# Patient Record
Sex: Male | Born: 1975 | Race: White | Hispanic: No | Marital: Single | Smoking: Current every day smoker
Health system: Southern US, Community
[De-identification: ages and names within clinical notes are randomized; demographics above are authoritative.]

## PROBLEM LIST (undated history)

## (undated) DIAGNOSIS — Z720 Tobacco use: Secondary | ICD-10-CM

## (undated) DIAGNOSIS — K7460 Unspecified cirrhosis of liver: Secondary | ICD-10-CM

## (undated) DIAGNOSIS — M51369 Other intervertebral disc degeneration, lumbar region without mention of lumbar back pain or lower extremity pain: Secondary | ICD-10-CM

## (undated) DIAGNOSIS — B1920 Unspecified viral hepatitis C without hepatic coma: Secondary | ICD-10-CM

## (undated) DIAGNOSIS — M5136 Other intervertebral disc degeneration, lumbar region: Secondary | ICD-10-CM

## (undated) DIAGNOSIS — G894 Chronic pain syndrome: Secondary | ICD-10-CM

## (undated) HISTORY — DX: Tobacco use: Z72.0

## (undated) HISTORY — DX: Chronic pain syndrome: G89.4

## (undated) HISTORY — DX: Unspecified cirrhosis of liver: K74.60

## (undated) HISTORY — PX: NO PAST SURGERIES: SHX2092

## (undated) HISTORY — DX: Other intervertebral disc degeneration, lumbar region: M51.36

## (undated) HISTORY — DX: Other intervertebral disc degeneration, lumbar region without mention of lumbar back pain or lower extremity pain: M51.369

## (undated) HISTORY — DX: Unspecified viral hepatitis C without hepatic coma: B19.20

---

## 2001-07-29 ENCOUNTER — Emergency Department (HOSPITAL_COMMUNITY): Admission: EM | Admit: 2001-07-29 | Discharge: 2001-07-29 | Payer: Self-pay | Admitting: Emergency Medicine

## 2001-07-29 ENCOUNTER — Encounter: Payer: Self-pay | Admitting: Emergency Medicine

## 2002-05-13 ENCOUNTER — Encounter: Payer: Self-pay | Admitting: Emergency Medicine

## 2002-05-13 ENCOUNTER — Emergency Department (HOSPITAL_COMMUNITY): Admission: EM | Admit: 2002-05-13 | Discharge: 2002-05-13 | Payer: Self-pay

## 2002-09-25 ENCOUNTER — Emergency Department (HOSPITAL_COMMUNITY): Admission: EM | Admit: 2002-09-25 | Discharge: 2002-09-25 | Payer: Self-pay | Admitting: Emergency Medicine

## 2002-10-14 ENCOUNTER — Ambulatory Visit (HOSPITAL_COMMUNITY): Admission: RE | Admit: 2002-10-14 | Discharge: 2002-10-14 | Payer: Self-pay | Admitting: *Deleted

## 2002-10-17 ENCOUNTER — Encounter: Payer: Self-pay | Admitting: *Deleted

## 2002-10-17 ENCOUNTER — Ambulatory Visit (HOSPITAL_COMMUNITY): Admission: RE | Admit: 2002-10-17 | Discharge: 2002-10-17 | Payer: Self-pay | Admitting: *Deleted

## 2002-10-24 ENCOUNTER — Encounter: Payer: Self-pay | Admitting: *Deleted

## 2002-10-24 ENCOUNTER — Ambulatory Visit (HOSPITAL_COMMUNITY): Admission: RE | Admit: 2002-10-24 | Discharge: 2002-10-24 | Payer: Self-pay | Admitting: *Deleted

## 2003-01-02 ENCOUNTER — Emergency Department (HOSPITAL_COMMUNITY): Admission: EM | Admit: 2003-01-02 | Discharge: 2003-01-02 | Payer: Self-pay | Admitting: Emergency Medicine

## 2007-06-18 ENCOUNTER — Emergency Department (HOSPITAL_COMMUNITY): Admission: EM | Admit: 2007-06-18 | Discharge: 2007-06-18 | Payer: Self-pay | Admitting: Emergency Medicine

## 2007-06-24 ENCOUNTER — Emergency Department (HOSPITAL_COMMUNITY): Admission: EM | Admit: 2007-06-24 | Discharge: 2007-06-24 | Payer: Self-pay | Admitting: Emergency Medicine

## 2007-08-13 ENCOUNTER — Emergency Department (HOSPITAL_COMMUNITY): Admission: EM | Admit: 2007-08-13 | Discharge: 2007-08-14 | Payer: Self-pay | Admitting: Emergency Medicine

## 2007-11-11 ENCOUNTER — Emergency Department (HOSPITAL_COMMUNITY): Admission: EM | Admit: 2007-11-11 | Discharge: 2007-11-11 | Payer: Self-pay | Admitting: Emergency Medicine

## 2007-12-02 ENCOUNTER — Encounter: Admission: RE | Admit: 2007-12-02 | Discharge: 2007-12-03 | Payer: Self-pay | Admitting: Anesthesiology

## 2007-12-03 ENCOUNTER — Ambulatory Visit: Payer: Self-pay | Admitting: Anesthesiology

## 2010-06-14 NOTE — Assessment & Plan Note (Signed)
[facility].  I evaluated  him and reviewed the Health and History form and 14-point review of  systems.  I reviewed available records and progress today.  I referred  through Dr. Lyn Hollingshead, relates primary care Dr. Clovis Riley.  Previously,  treated by a doctor, no longer in practice here, Dr. Lew Dawes.   Long standing low back, left leg, and shoulder pain, difficulty with  endurance and range of motion activities.  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  He relates no change  in bowel or bladder disorder, relates no advancing neurological features  of his typical pain.  He has seen orthopedist about his shoulder.  He  has seen others about his low back, states nonsurgical.  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  He relates no specific range of  motion deficit.  He relates 10/10, but he is sitting comfortably.  Made  worse by standing and activities, improved with rest and medications.  He is working about 10 hours a week.  He has not worked over the past 3  weeks and states that he must continue to work.  He describes numbness,  tingling, trouble walking, spasms, but is seen ambulating without  difficulty.  He states situational depression.  He states no wish to  harm himself or others on 14-point review of systems.   PMH remarkable for, otherwise stating healthy.  He has not had a recent  MRI.  He is divorced, but he has his fiance with him.  He smokes 1 pack  per day and I counseled.   PAST SURGICAL HISTORY:  Denied.   PAST MEDICAL HISTORY:  Denied.   FAMILY HISTORY:  Remarkable for hypertension.   REVIEW OF SYSTEMS/FAMILY AND SOCIAL HISTORY:  Otherwise, noncontributory  except for  the pain problem.   PHYSICAL EXAMINATION:  GENERAL:  A pleasant male sitting comfortably in  bed, no evidence of discomfort.  Affect appearance normal.  Oriented x3.  HEENT:  Remarkable for poor dentition.  CHEST:  Clear to auscultation and percussion with increased AP diameter.  Regular rate and rhythm without rub, murmur, or gallop.  ABDOMEN:  Soft, nontender, and benign.  No hepatosplenomegaly.  He has  left side cervical RF.  He has pain with range of motion activities with  straight leg unimpaired.  EHL is intact as his neurological assessment.  His left shoulder has a fair range of motion.  Abduction is somewhat  limited secondary to pain and he has paracervical myofascial discomfort.  I do not find anything otherwise changed here.  Good vascular integrity.   IMPRESSION:  Degenerative spondylosis lumbar spine, radicular systems,  and osteoarthritis of shoulder.   PLAN:  1. He relates the fact that OxyContin works best for him and he      requests specifically Soma by name.  2. These all related to him, we are going to need to get to know him      to better understand his pain and so we are going to be doing      pharmacy checks, central check unrevealing, so actually I have to      call the pharmacy.  We are going to also find out  who his primary      care physician is and I want him to remain in contact with that      primary care physician.  Cigarette cessation is mandatory for best      outcome.  3. I talked about interventional procedures.  4. We will start with tramadol and Flexeril at bedtime, with cautions      given.  Full informed consent UDS.  5. We have somewhat of a circular discussion about his preference for      medications, but I do not want to go and to control substances at      this time.  We want to have a better understanding, and the risk or      benefit clearly being in his favor.  He states he has not had any      medicine in sometime, then he states 3  days, so the UDS will be      revealing.   No barrier to communication.           ______________________________  Celene Kras, MD     HH/MedQ  D:  12/03/2007 11:23:36  T:  12/04/2007 00:17:14  Job #:  [id number]

## 2010-06-14 NOTE — Consult Note (Signed)
Mike Sosa, Mike Sosa NO.:  [id number]   MEDICAL RECORD NO.:  [id number]          PATIENT TYPE:  EMS   LOCATION:  MAJO                         FACILITY:  [facility]   PHYSICIAN:  Lennie Muckle, MD      DATE OF BIRTH:  04-03-1975   DATE OF CONSULTATION:  08/14/2007  DATE OF DISCHARGE:  08/14/2007                                 CONSULTATION   REASON FOR CONSULT:  Right-sided abdominal pain and flank pain.   HISTORY OF PRESENT ILLNESS:  Mike Sosa is a 35 year old male who reports  having onset of right flank pain at approximately 9 a.m. yesterday.  The  pain subsided and then returned approximately 2 p.m. yesterday.  He had  no fevers or chills, but the pain was described as intermittently sharp  in nature at 10/10, was located in the right flank region, and it did  radiate down to his lower back.  He had previously told the emergency  department that he had a right flank pain with radiation to the right  upper quadrant.  He did have associated nausea and vomiting.  He had had  also some pain with urination.  No frank blood, but had hematuria on his  UA.   He describes eating pork tenderloin yesterday and squash.  He has had no  previous episodes of right upper quadrant pain, nausea, or vomiting.  He  occasionally has reflux disease with regurgitation and a bad taste in  his mouth mostly at night.  He takes no medications for this.  He was  evaluated with a CT scan in the emergency department with no ureteral  dilation.  No ureteral or bladder calculi.  He has colonic  diverticulosis.  There was a 14-mm stone in the neck of the gallbladder  with sludge.  His ultrasound revealed a stone in the neck of the  gallbladder.  No wall thickening or pericholecystic fluid.   PAST MEDICAL HISTORY:  Negative.   SURGICAL HISTORY:  Negative.   SOCIAL HISTORY:  He does smoke approximately 1-1/2 pack per day.  No  marijuana or alcohol use.   MEDICATIONS:  He had taken Percocet  earlier today from the previous fall  and occasional p.r.n. diazepam.   REVIEW OF SYSTEMS:  Negative other than the HPI.   PHYSICAL EXAMINATION:  VITAL SIGNS:  Blood pressure is 126/85 and temp  97.5.  HEENT:  Extraocular muscles are intact.  Anicteric.  CHEST:  Clear to auscultation bilaterally.  CARDIOVASCULAR:  Regular rate and rhythm.  ABDOMEN:  Soft and nontender.  No peritoneal signs.  His abdomen is  minimally tender in the right upper quadrant with deep palpation.  He  has CVA tenderness and pain in his back on palpation.   LABORATORY DATA:  White count is normal at 8.7, hemoglobin and  hematocrit 14 and 42, and platelet counts are 113.  Serum chemistries:  Sodium 142, potassium 3.9, BUN and creatinine 19 and 1.2.  Liver enzymes  are elevated at 135, an ALT and AST.  Alkaline phosphatase is normal at  81.  Bilirubin normal at 0.7.  UA is cloudy with a large amount of  hemoglobin and small amount of leukocyte esterase.   ASSESSMENT AND PLAN:  1. Cholelithiasis without evidence of cholecystitis.  2. Costovertebral angle tenderness consistent with possible either      kidney infection or previous passage of a renal stone.  I discussed      with Mike Sosa that I felt his symptoms did not seem consistent with      symptomatic cholelithiasis or cholecystitis, and that I have felt      it is more likely a renal etiology.  He was therefore instructed to      drink plenty of fluids, and he will be given a prescription for      Percocet by the emergency department.  He does have a stone      significant in the gallbladder, but I do not think this is causing      his symptoms.  I have instructed him to follow up with the primary      care physician due to his elevation of liver enzymes, just to      repeat those, and ensure that there is nothing worrisome.  I      instructed him if he does have right upper quadrant pain or is      worried about any symptoms in this area, he should have  this      followed up.  There is no acute surgical need at the present time.      Therefore, he will be discharged home and follow up with surgery on      a p.r.n. basis.  I did offer to see in in a couple weeks for follow      up if he was not able to establish care with a PCP.      Lennie Muckle, MD  Electronically Signed     ALA/MEDQ  D:  08/14/2007  T:  08/14/2007  Job:  [id number]

## 2010-10-27 LAB — DIFFERENTIAL
Basophils Absolute: 0.1
Eosinophils Relative: 3
Lymphocytes Relative: 25
Lymphs Abs: 2.1
Neutro Abs: 5.3
Neutrophils Relative %: 61

## 2010-10-27 LAB — URINE MICROSCOPIC-ADD ON

## 2010-10-27 LAB — POCT I-STAT, CHEM 8
Creatinine, Ser: 1.2
HCT: 44
Hemoglobin: 15
Potassium: 3.9
Sodium: 142

## 2010-10-27 LAB — HEPATIC FUNCTION PANEL
ALT: 164 — ABNORMAL HIGH
AST: 135 — ABNORMAL HIGH
Albumin: 3.5
Indirect Bilirubin: 0.5
Total Protein: 6.9

## 2010-10-27 LAB — URINALYSIS, ROUTINE W REFLEX MICROSCOPIC
Specific Gravity, Urine: 1.027
Urobilinogen, UA: 1
pH: 7.5

## 2010-10-27 LAB — URINE CULTURE

## 2010-10-27 LAB — CBC: RBC: 4.49

## 2014-02-09 ENCOUNTER — Ambulatory Visit (INDEPENDENT_AMBULATORY_CARE_PROVIDER_SITE_OTHER): Payer: BLUE CROSS/BLUE SHIELD | Admitting: Internal Medicine

## 2014-02-09 VITALS — BP 140/92 | HR 99 | Temp 98.2°F | Resp 18 | Ht 73.0 in | Wt 240.6 lb

## 2014-02-09 DIAGNOSIS — G8929 Other chronic pain: Secondary | ICD-10-CM

## 2014-02-09 DIAGNOSIS — Z7189 Other specified counseling: Secondary | ICD-10-CM

## 2014-02-09 DIAGNOSIS — Z6831 Body mass index (BMI) 31.0-31.9, adult: Secondary | ICD-10-CM

## 2014-02-09 DIAGNOSIS — Z720 Tobacco use: Secondary | ICD-10-CM

## 2014-02-09 DIAGNOSIS — IMO0001 Reserved for inherently not codable concepts without codable children: Secondary | ICD-10-CM | POA: Insufficient documentation

## 2014-02-09 DIAGNOSIS — F17200 Nicotine dependence, unspecified, uncomplicated: Secondary | ICD-10-CM

## 2014-02-09 DIAGNOSIS — M5416 Radiculopathy, lumbar region: Secondary | ICD-10-CM

## 2014-02-09 DIAGNOSIS — R030 Elevated blood-pressure reading, without diagnosis of hypertension: Secondary | ICD-10-CM

## 2014-02-09 MED ORDER — OXYCODONE HCL 30 MG PO TABS: 30.0000 mg | ORAL_TABLET | ORAL | Status: DC | PRN

## 2014-02-09 NOTE — Progress Notes (Signed)
Subjective:  This chart was scribed for Mike Pearsonobert P Baran Kuhrt, MD by Mike Sosa, ED Scribe. The patient was seen in room 9. Patient's care was started at 12:51 PM. First urgent medical [facility] family care visit   Patient ID: Mike Sosa, male    DOB: 12-Dec-1975, 39 y.o.   MRN: [id number]  Chief Complaint  Patient presents with  . Back Pain    for some years   HPI HPI Comments: Mike Sosa is a 39 y.o. male who [facility] complaining of chronic back pain that radiates into L leg for many years following a 11 ft fall that caused to herniated disc. He elected to avoid surgery in 2006 in the years following that and instead was in chronic pain therapy. Pt states that he was seeing Dr. Manson [facility]seyBrown at the [facility] in [place] for 30 mg oxycodone every 4 hours as needed for 6 years. He states that the pain clinic was bought out by [facility] and they stopped accepting his insurance. Pt states that he had 60 days to make arrangements and so he changed to preferred pain management, but they have continually had a minister of problems over the past few months with him. He is unhappy with his care there.  Pt states that he has contacted another pain clinic with [place]. Genella [place], but he can not be seen for another 1.5 months.   Pt states that he has tried to stretch out his Gabapentin (when necessary at bedtime for restless legs) and Oxycodone tablets; he took his last pill yesterday. Pt has not had any back surgeries. Pt does Holiday representativeconstruction and tree work. He has been treating with an inversion table with relief. Pt has also seen a Chiropractor with no relief. Pt reports that his last MRI was approximately 5 years ago. He has trouble with gait when he doesn't take pain medication, and has to limp.  Past Medical History  Diagnosis Date  . Depression   . Anxiety    stable without any medications No current outpatient prescriptions on file prior  to visit.   No current facility-administered medications on file prior to visit.   No Known Allergies   fam hx--married/39yo at home  Review of Systems  Constitutional: Negative for appetite change, fatigue and unexpected weight change.  HENT: Negative for hearing loss and trouble swallowing.   Eyes: Negative for visual disturbance.  Respiratory: Negative for chest tightness and shortness of breath.   Cardiovascular: Negative for chest pain, palpitations and leg swelling.  Gastrointestinal: Negative.   Genitourinary: Negative.   Musculoskeletal:       No other major joint problems besides the spine  Skin: Negative.   Neurological: Negative for dizziness, weakness, light-headedness, numbness and headaches.  Hematological: Does not bruise/bleed easily.  Psychiatric/Behavioral: Negative for behavioral problems.      Objective:   Physical Exam  Constitutional: He is oriented to person, place, and time. He appears well-developed and well-nourished. No distress.  HENT:  Head: Normocephalic and atraumatic.  Eyes: Conjunctivae and EOM are normal. Pupils are equal, round, and reactive to light.  Neck: Normal range of motion. Neck supple.  Cardiovascular: Normal rate and regular rhythm.   Pulmonary/Chest: Effort normal.  Musculoskeletal: He exhibits no edema.  He has obvious Upper and lower L extremity muscle atrophy compared to the R leg.  3 beats of ankle clonus on the L.  Straight leg raise above 45  degrees on L produces lumbar pain, radicular sxt  Tender over lower lumbar in general  Neurological: He is alert and oriented to person, place, and time. No cranial nerve deficit or sensory deficit. Coordination normal.  Reflex Scores:      Patellar reflexes are 1+ on the right side and 2+ on the left side. 2+ patellar reflex on L, 1+ on R. No sensory losses noted.  Skin: Skin is warm and dry.  Psychiatric: He has a normal mood and affect. His behavior is normal. Thought content normal.   Nursing note and vitals reviewed. BP 140/92 mmHg  Pulse 99  Temp(Src) 98.2 F (36.8 C) (Oral)  Resp 18  Ht  (1.854 m)  Wt 240 lb 9.6 oz (109.135 kg)  BMI 31.75 kg/m2  SpO2 97%    Assessment & Plan:  Smoker  BMI 31.0-31.9,adult  Chronic radicular lumbar pain  Encounter for chronic pain management  Elevated BP  Patient Instructions  You should ask Dr Genella Mech for a referral to primary care in your area to address your chronic health problems:  1/Smoker---your back problems will worsen due to smoking  2/ overweight--BMI 31.0-31.9,adult  3/ Elevated BP without diagnosis of need for meds yet  At 38 you need evaluation to rule out diabetes as well as elevated cholesterol due to your obesity  You could also set up an appointment in our office with one of our new providers for this type of care if you wish---[phone] and ask for appointments Because of the muscle atrophy associated with your back problem, you need consult with orthopedics or neurosurgery in the near future  You have been given a one month prescription at the dose you reported to enable you to transition to your new pain management center. I understand you will provide [place] with your past medical records from Dr Manson Passey.    Meds ordered this encounter  Medications  . oxycodone (ROXICODONE) 30 MG immediate release tablet((NOT CAPSULES))    Sig: Take 1 tablet (30 mg total) by mouth every 4 (four) hours as needed for pain. Represents a one month supply as he is transitioning to a new pain management program.    Dispense:  180 tablet    Refill:  0   He may f/u 1 month if necessary  I have completed the patient encounter in its entirety as documented by the scribe, with editing by me where necessary. Derreon Consalvo P. Merla Riches, M.D.

## 2014-02-09 NOTE — Patient Instructions (Signed)
You should ask Dr Genella Mechainwater for a referral to primary care in your area to address your chronic health problems:  1/Smoker---your back problems will worsen due to smoking  2/ overweight--BMI 31.0-31.9,adult  3/ Elevated BP without diagnosis of need for meds yet  At 38 you need evaluation to rule out diabetes as well as elevated cholesterol due to your obesity  You could also set up an appointment in our office with one of our new providers for this type of care if you wish---[phone] and ask for appointments Because of the muscle atrophy associated with your back problem, you need consult with orthopedics or neurosurgery in the near future  You have been given a one month prescription at the dose you reported to enable you to transition to your new pain management center. I understand you will provide us with your past medical records from Dr Manson PasseyBrown.

## 2014-02-25 ENCOUNTER — Telehealth: Payer: Self-pay

## 2014-02-25 DIAGNOSIS — M545 Low back pain: Principal | ICD-10-CM

## 2014-02-25 DIAGNOSIS — G8929 Other chronic pain: Secondary | ICD-10-CM

## 2014-02-25 NOTE — Telephone Encounter (Signed)
Patient needs a referral to pain management to [place]

## 2014-02-25 NOTE — Telephone Encounter (Signed)
Dr Merla Richesoolittle, referral pending. Is this ok?

## 2014-02-27 NOTE — Telephone Encounter (Signed)
Spoke with pt, advised referral placed. 

## 2014-03-03 ENCOUNTER — Ambulatory Visit (INDEPENDENT_AMBULATORY_CARE_PROVIDER_SITE_OTHER): Payer: BLUE CROSS/BLUE SHIELD | Admitting: Internal Medicine

## 2014-03-03 VITALS — BP 130/80 | HR 95 | Temp 99.2°F | Resp 16 | Ht 72.0 in | Wt 220.4 lb

## 2014-03-03 DIAGNOSIS — M5416 Radiculopathy, lumbar region: Secondary | ICD-10-CM

## 2014-03-03 DIAGNOSIS — G8929 Other chronic pain: Secondary | ICD-10-CM

## 2014-03-03 DIAGNOSIS — Z7189 Other specified counseling: Secondary | ICD-10-CM

## 2014-03-03 DIAGNOSIS — IMO0001 Reserved for inherently not codable concepts without codable children: Secondary | ICD-10-CM

## 2014-03-03 DIAGNOSIS — R030 Elevated blood-pressure reading, without diagnosis of hypertension: Secondary | ICD-10-CM

## 2014-03-03 MED ORDER — OXYCODONE HCL 30 MG PO TABS: 30.0000 mg | ORAL_TABLET | ORAL | Status: DC | PRN

## 2014-03-03 MED ORDER — GABAPENTIN 300 MG PO CAPS: 300.0000 mg | ORAL_CAPSULE | Freq: Every day | ORAL | Status: DC

## 2014-03-03 NOTE — Progress Notes (Addendum)
Subjective:  This chart was scribed for [name], MD by Richarda Overlie, Medical scribe. This patient was seen in ROOM 5 and the patient's care was started 6:43 PM.   Patient ID: Mike Sosa, male    DOB: 02-03-1975, 39 y.o.   MRN: [id number]   Chief Complaint  Patient presents with   Medication Refill    Gabapentin & Oxycodone   HPI  HPI Comments: Mike Sosa is a 39 y.o. male with a history of depression and axiety who presents to [facility] for medication refills for gabapentin and oxycodone. Pt states that he has been doing well since his last visit to [facility]. Pt states that he needs a refill of Neurontin and says he uses it as needed to help him sleep. He needs a refill for oxycodone 30 mg every 4 hours while he is awaiting establishment of a pain management appointment with a [place] clinic operated by Dr. Genella Mech. He complains of no other symptoms at this time.    Past Medical History  Diagnosis Date   Depression    Anxiety    psychological symptoms have been stable over the past month Patient Active Problem List   Diagnosis Date Noted   Smoker--- he continues smoking  02/09/2014   BMI 31.0-31.9,adult 02/09/2014   Chronic radicular lumbar pain 02/09/2014   Encounter for chronic pain management 02/09/2014   Elevated BP 02/09/2014   Current Outpatient Prescriptions on File Prior to Visit  Medication Sig Dispense Refill   gabapentin (NEURONTIN) 300 MG capsule Take 300 mg by mouth 3 (three) times daily.     oxycodone (ROXICODONE) 30 MG immediate release tablet Take 1 tablet (30 mg total) by mouth every 4 (four) hours as needed for pain. Represents a one month supply as he is transitioning to a new pain management program. 180 tablet 0   No current facility-administered medications on file prior to visit.   No Known Allergies History reviewed. No pertinent past surgical history. History reviewed. No pertinent family history.    Review of Systems   noncontributory     Objective:   Physical Exam  Constitutional: He is oriented to person, place, and time. He appears well-developed and well-nourished. No distress.  HENT:  Head: Normocephalic and atraumatic.  Eyes: Conjunctivae and EOM are normal. Pupils are equal, round, and reactive to light.  Neck: Neck supple.  Cardiovascular: Normal rate.   Pulmonary/Chest: Effort normal.  Neurological: He is alert and oriented to person, place, and time. No cranial nerve deficit.  Psychiatric: He has a normal mood and affect. His behavior is normal.  Nursing note and vitals reviewed.  Filed Vitals:   03/03/14 1805  BP: 130/80  Pulse: 95  Temp: 99.2 F (37.3 C)  TempSrc: Oral  Resp: 16  Height: 6' (1.829 m)  Weight: 220 lb 6 oz (99.961 kg)  SpO2: 96%      Assessment & Plan:  Chronic radicular lumbar pain  Awaiting pain mgmt in [place] Dr Genella Mech  Encounter for chronic pain management  Last rx 02/09/14 for 1 month--will write for refill 1 month after this  Elevated BP  Much better today  Meds ordered this encounter  Medications   oxycodone (ROXICODONE) 30 MG immediate release tablet    Sig: Take 1 tablet (30 mg total) by mouth every 4 (four) hours as needed for pain. To follow last one month supply    Dispense:  180 tablet    Refill:  0   gabapentin (  NEURONTIN) 300 MG capsule    Sig: Take 1-2 capsules (300-600 mg total) by mouth at bedtime.    Dispense:  60 capsule    Refill:  0   Addendum 03/26/2014  ----- Message -----    From: Waymon Amatoonna F Burton    Sent: 03/24/2014  11:14 AM      To: Tonye Pearsonobert P Doolittle, MD  Pt has been declined from regional pain due to history of police records with convictions involving controlled substances   I asked referral to Inform  Patient--- because we were not aware of his prior convictions he now has to assume full responsibility for setting up follow-up with pain management. His options include [facility], [facility] ,  [facility] -Dr Hermelinda MedicusSchwartz, and Dr Roderic [place]. He should start making contact immediately ---all contact info available online---I will send a letter as well ===View-only below this line===   ----- Message -----    From: Waymon Amatoonna F Burton    Sent: 03/24/2014  11:14 AM      To: Tonye Pearsonobert P Doolittle, MD  Pt has been declined from regional pain due to history of police records with convictions involving controlled substances

## 2014-03-04 ENCOUNTER — Telehealth: Payer: Self-pay

## 2014-03-04 NOTE — Telephone Encounter (Signed)
Dr. Merla Richesoolittle ok for early refill?

## 2014-03-04 NOTE — Telephone Encounter (Signed)
Pt calling again regarding this issue. Please advise.

## 2014-03-04 NOTE — Telephone Encounter (Signed)
Patient states pharmacy will not refill his oxycodone (ROXICODONE) 30 MG immediate release tablet B/c he had it filled on the 11th of January   He is requesting an early refill.  He is leaving town today and would need it done ASAP.   [phone]

## 2014-03-04 NOTE — Telephone Encounter (Signed)
He is a new patient in our system He is awaiting pain management referral This is his second prescription refill We cannot allow early refills with pain management- he must arrange travels around his next due date would  be February 10--- the prescription was written to refill one month after his last prescription

## 2014-03-05 NOTE — Telephone Encounter (Signed)
Left a detailed message on pt's voicemail explaining request denied.

## 2014-03-05 NOTE — Telephone Encounter (Signed)
Patient returned call. He was notified and voiced understanding.

## 2014-03-26 ENCOUNTER — Encounter: Payer: Self-pay | Admitting: Internal Medicine

## 2014-03-31 ENCOUNTER — Ambulatory Visit (INDEPENDENT_AMBULATORY_CARE_PROVIDER_SITE_OTHER): Payer: BLUE CROSS/BLUE SHIELD | Admitting: Internal Medicine

## 2014-03-31 VITALS — BP 174/88 | HR 99 | Temp 98.0°F | Resp 18 | Ht 73.0 in | Wt 235.0 lb

## 2014-03-31 DIAGNOSIS — IMO0001 Reserved for inherently not codable concepts without codable children: Secondary | ICD-10-CM

## 2014-03-31 DIAGNOSIS — R030 Elevated blood-pressure reading, without diagnosis of hypertension: Secondary | ICD-10-CM

## 2014-03-31 DIAGNOSIS — Z7189 Other specified counseling: Secondary | ICD-10-CM

## 2014-03-31 DIAGNOSIS — Z720 Tobacco use: Secondary | ICD-10-CM

## 2014-03-31 DIAGNOSIS — Z8042 Family history of malignant neoplasm of prostate: Secondary | ICD-10-CM | POA: Insufficient documentation

## 2014-03-31 DIAGNOSIS — F17200 Nicotine dependence, unspecified, uncomplicated: Secondary | ICD-10-CM

## 2014-03-31 DIAGNOSIS — G8929 Other chronic pain: Secondary | ICD-10-CM

## 2014-03-31 DIAGNOSIS — M5416 Radiculopathy, lumbar region: Secondary | ICD-10-CM | POA: Diagnosis not present

## 2014-03-31 DIAGNOSIS — Z800 Family history of malignant neoplasm of digestive organs: Secondary | ICD-10-CM | POA: Insufficient documentation

## 2014-03-31 MED ORDER — OXYCODONE HCL 30 MG PO TABS: 30.0000 mg | ORAL_TABLET | ORAL | Status: DC | PRN

## 2014-03-31 MED ORDER — GABAPENTIN 300 MG PO CAPS: 300.0000 mg | ORAL_CAPSULE | Freq: Every day | ORAL | Status: DC

## 2014-03-31 NOTE — Progress Notes (Signed)
This chart was scribed for [name], MD by Luisa Dago, ED Scribe. This patient was seen in room 3 and the patient's care was started at 7:27 PM.  Subjective:    Patient ID: Mike Sosa, male    DOB: 03/09/1975, 39 y.o.   MRN: [id number]  Chief Complaint  Patient presents with  . Medication Refill    oxycodone, gabapentin   . Hypertension    HPI Mike Sosa is a 39 y.o. male with PMhx of tobacco usage, chronic radicular lumbar pain, and HTN  Pt presents to the office for a refill on his oxycodone and gabapentin cause he has been unable to establish an outpatient appointment with pain management.Marland Kitchen   [facility] [place], but we received a note for them that he was declined due to his h/o police record with convictions involving controlled substances. Pt states that his conviction was a long time ago and should not get in the way of being admitted to regional [facility]. Felony charge for marijuana. They have told him that they will call him on March 8 about an appointment however.  [place] controlled substances reporting system reveals no inappropriate prescriptions over the last 6 months--- note first visit here 02/09/2014  Pt denies any fever, neck pain, sore throat, visual disturbance, CP, cough, SOB, abdominal pain, nausea, emesis, diarrhea, urinary symptoms, back pain, HA, weakness, numbness and rash as associated symptoms.  He admits being stressed by work today and thinks this is wise blood pressures elevated.   Patient Active Problem List   Diagnosis Date Noted  . Smoker 02/09/2014  . BMI 31.0-31.9,adult 02/09/2014  . Chronic radicular lumbar pain 02/09/2014  . Encounter for chronic pain management 02/09/2014  . Elevated BP 02/09/2014   Past Medical History  Diagnosis Date  . Depression   . Anxiety    No past surgical history on file. No Known Allergies Prior to Admission medications   Medication Sig Start Date End  Date Taking? Authorizing Provider  gabapentin (NEURONTIN) 300 MG capsule Take 1-2 capsules (300-600 mg total) by mouth at bedtime. 03/03/14  Yes Tonye Pearson, MD  oxycodone (ROXICODONE) 30 MG immediate release tablet Take 1 tablet (30 mg total) by mouth every 4 (four) hours as needed for pain. To follow last one month supply 03/03/14  Yes Tonye Pearson, MD   Continues smoking  Family history includes colon cancer in mother and prostate cancer in father   Review of Systems  Constitutional: Negative for fever and chills.  HENT: Negative for congestion.   Respiratory: Negative for cough and shortness of breath.   Cardiovascular: Negative for chest pain.  Gastrointestinal: Negative for nausea, vomiting, abdominal pain and diarrhea.  Genitourinary: Negative for difficulty urinating.  Skin: Negative for rash.  Neurological: Negative for dizziness, weakness, numbness and headaches.   Objective:   Physical Exam  Constitutional: He is oriented to person, place, and time. He appears well-developed and well-nourished.  HENT:  Head: Normocephalic and atraumatic.  Eyes: EOM are normal. Pupils are equal, round, and reactive to light.  Cardiovascular: Normal rate.   Pulmonary/Chest: Effort normal.  Musculoskeletal: He exhibits no edema.  Neurological: He is alert and oriented to person, place, and time. No cranial nerve deficit.  Skin: Skin is warm and dry.  Psychiatric: He has a normal mood and affect. His behavior is normal. Thought content normal.  Nursing note and vitals reviewed.  BP 174/88 mmHg  Pulse 99  Temp(Src) 98 F (36.7 C) (Oral)  Resp 18  Ht 6\' 1"  (1.854 m)  Wt 235 lb (106.595 kg)  BMI 31.01 kg/m2  SpO2 98%   Assessment & Plan:  Chronic radicular lumbar pain--- pain is controlled by current regimen  Encounter for chronic pain management--- I have now given him the names of4- 5 different pain management groups locally for him to contact as his next step. He is  advised to get his old records if at all possible. His MRI was 6 years ago. He may need to go back through a full back evaluation.  Elevated BP--- once again he is given the number appointment center to set up an evaluation is appropriate for his age and further investigations with regard to smoking and hypertension as well as his important family history.  Smoker-not ready to quit    I have completed the patient encounter in its entirety as documented by the scribe, with editing by me where necessary. Talley Kreiser P. Merla Richesoolittle, M.D.

## 2014-04-02 ENCOUNTER — Telehealth: Payer: Self-pay

## 2014-04-02 DIAGNOSIS — G894 Chronic pain syndrome: Secondary | ICD-10-CM

## 2014-04-02 DIAGNOSIS — G8929 Other chronic pain: Secondary | ICD-10-CM

## 2014-04-02 DIAGNOSIS — M549 Dorsalgia, unspecified: Secondary | ICD-10-CM

## 2014-04-02 NOTE — Telephone Encounter (Signed)
Patient requesting to be referred to [facility]. Patients call back number is [phone]

## 2014-04-02 NOTE — Telephone Encounter (Signed)
Assessment & Plan:  Chronic radicular lumbar pain--- pain is controlled by current regimen  Encounter for chronic pain management--- I have now given him the names of4- 5 different pain management groups locally for him to contact as his next step. He is advised to get his old records if at all possible. His MRI was 6 years ago. He may need to go back through a full back evaluation.  Elevated BP--- once again he is given the number appointment center to set up an evaluation is appropriate for his age and further investigations with regard to smoking and hypertension as well as his important family history.  Smoker-not ready to quit        Dr. Merla Richesoolittle, please advise.

## 2014-04-03 NOTE — Telephone Encounter (Signed)
[facility] referral placed

## 2014-04-03 NOTE — Telephone Encounter (Signed)
Okay to establish that referral for chronic pain syndrome and chronic back pain

## 2014-05-04 ENCOUNTER — Ambulatory Visit (INDEPENDENT_AMBULATORY_CARE_PROVIDER_SITE_OTHER): Payer: BLUE CROSS/BLUE SHIELD

## 2014-05-04 ENCOUNTER — Ambulatory Visit (INDEPENDENT_AMBULATORY_CARE_PROVIDER_SITE_OTHER): Payer: BLUE CROSS/BLUE SHIELD | Admitting: Internal Medicine

## 2014-05-04 VITALS — BP 148/78 | HR 100 | Temp 98.1°F | Resp 16 | Ht 73.0 in | Wt 255.0 lb

## 2014-05-04 DIAGNOSIS — M5136 Other intervertebral disc degeneration, lumbar region: Secondary | ICD-10-CM | POA: Diagnosis not present

## 2014-05-04 DIAGNOSIS — M545 Low back pain, unspecified: Secondary | ICD-10-CM

## 2014-05-04 DIAGNOSIS — M25552 Pain in left hip: Secondary | ICD-10-CM | POA: Diagnosis not present

## 2014-05-04 DIAGNOSIS — G8929 Other chronic pain: Secondary | ICD-10-CM

## 2014-05-04 MED ORDER — OXYCODONE HCL 30 MG PO TABS: 30.0000 mg | ORAL_TABLET | ORAL | Status: DC | PRN

## 2014-05-04 MED ORDER — GABAPENTIN 300 MG PO CAPS: 300.0000 mg | ORAL_CAPSULE | Freq: Every day | ORAL | Status: DC

## 2014-05-04 NOTE — Progress Notes (Addendum)
Subjective:  This chart was scribed for Mike Siaobert Collan Schoenfeld, MD by [facility] IncNadim Abu Sosa, medical scribe at [facility].The patient was seen in exam room 11 and the patient's care was started at 7:10 PM.   Patient ID: Mike SkainsAndre M Sosa, male    DOB: September 03, 1975, 39 y.o.   MRN: [id number] Chief Complaint  Patient presents with  . Follow-up    Medication refill   HPI HPI Comments: Mike Skainsndre M Sieben is a 39 y.o. male who presents to [facility] for a medication refill. Note that at his last visit he was referred [facility] [place] and they have requested more recent imaging before they will begin treatment.  His last imaging was in 2009. He has been on pain medicine for a long time for his problems prior to his initial visit here. Pt states most pain is in left lower back radiating down his left leg to his toes. He has developed weakness in his left leg as well as numbness in his toes, the balls of his feet.  He gets in trouble when he is active. Pt states he helped fix his parents septic system 2 weeks ago, and after working his pain was more severe in his leg and he had trouble with ambulating.   Over the past 3 months he also developed pain in his left hip. This hurts while sitting and may ache at night. It hurts to step up on a ladder. No known injury. No swelling.  He still has not called to set up a CPE at 104  Patient Active Problem List   Diagnosis Date Noted  . Family hx of colon cancer 03/31/2014  . Family hx of prostate cancer 03/31/2014  . Smoker 02/09/2014  . BMI 31.0-31.9,adult 02/09/2014  . Chronic radicular lumbar pain 02/09/2014  . Encounter for chronic pain management 02/09/2014  . Elevated BP 02/09/2014   Past Medical History  Diagnosis Date  . Depression   . Anxiety    History reviewed. No pertinent past surgical history. No Known Allergies Prior to Admission medications   Medication Sig Start Date End Date Taking?  Authorizing Provider  gabapentin (NEURONTIN) 300 MG capsule Take 1-2 capsules (300-600 mg total) by mouth at bedtime. 03/31/14  Yes Tonye Pearsonobert P Aadhav Uhlig, MD  oxycodone (ROXICODONE) 30 MG immediate release tablet Take 1 tablet (30 mg total) by mouth every 4 (four) hours as needed for pain. To follow last one month supply 03/31/14  Yes Tonye Pearsonobert P Amro Winebarger, MD   Review of Systems  Constitutional: Negative for fever, chills, fatigue and unexpected weight change.  Genitourinary: Negative for frequency and difficulty urinating.  Musculoskeletal: Positive for back pain and gait problem.  Neurological: Positive for weakness and numbness.      Objective:  BP 148/78 mmHg  Pulse 100  Temp(Src) 98.1 F (36.7 C)  Resp 16  Ht 6\' 1"  (1.854 m)  Wt 255 lb (115.667 kg)  BMI 33.65 kg/m2  SpO2 97%  Physical Exam  Constitutional: He is oriented to person, place, and time. He appears well-developed and well-nourished. No distress.  HENT:  Head: Normocephalic and atraumatic.  Eyes: Pupils are equal, round, and reactive to light.  Neck: Normal range of motion.  Cardiovascular: Normal rate and regular rhythm.   Pulmonary/Chest: Effort normal. No respiratory distress.  Musculoskeletal: Normal range of motion.  Tender left lumbar with pain on straight leg raise on the left to 45 degrees. No sensory or motor  losses noted. The left hip has pain with external rotation, pain over the greater trochanter and decreased ROM. There is mild atrophy in left upper and lower areas of the lower extremity.  Neurological: He is alert and oriented to person, place, and time.  Skin: Skin is warm and dry.  Psychiatric: He has a normal mood and affect. His behavior is normal.  Nursing note and vitals reviewed.    [facility] reading (PRIMARY) by  Dr. Merla Riches degenerative changes of the lumbar spine. Hip appears normal   Assessment & Plan:  DDD (degenerative disc disease), lumbar - Plan: DG Lumbar Spine 2-3 Views  Pain, hip, left -  Plan: DG HIP UNILAT WITH PELVIS 2-3 VIEWS LEFT  secondary to trochanteric bursitis versus underlying osteoarthritis/NSAIDS plus ROM  We'll go ahead and schedule his MRI in preparation for his transfer to chronic pain management unless there are surgical alternatives that are seen Meds ordered this encounter  Medications  . gabapentin (NEURONTIN) 300 MG capsule    Sig: Take 1-2 capsules (300-600 mg total) by mouth at bedtime.    Dispense:  60 capsule    Refill:  0  . oxycodone (ROXICODONE) 30 MG immediate release tablet    Sig: Take 1 tablet (30 mg total) by mouth every 4 (four) hours as needed for pain. To follow last one month supply    Dispense:  180 tablet    Refill:  0      I have completed the patient encounter in its entirety as documented by the scribe, with editing by me where necessary. Modelle Vollmer P. Merla Riches, M.D.

## 2014-05-26 ENCOUNTER — Ambulatory Visit (INDEPENDENT_AMBULATORY_CARE_PROVIDER_SITE_OTHER): Payer: BLUE CROSS/BLUE SHIELD | Admitting: Internal Medicine

## 2014-05-26 VITALS — BP 150/80 | HR 100 | Temp 99.8°F | Ht 72.5 in | Wt 257.0 lb

## 2014-05-26 DIAGNOSIS — G894 Chronic pain syndrome: Secondary | ICD-10-CM

## 2014-05-26 DIAGNOSIS — IMO0001 Reserved for inherently not codable concepts without codable children: Secondary | ICD-10-CM

## 2014-05-26 DIAGNOSIS — K429 Umbilical hernia without obstruction or gangrene: Secondary | ICD-10-CM

## 2014-05-26 DIAGNOSIS — Z7189 Other specified counseling: Secondary | ICD-10-CM

## 2014-05-26 DIAGNOSIS — M5136 Other intervertebral disc degeneration, lumbar region: Secondary | ICD-10-CM | POA: Diagnosis not present

## 2014-05-26 DIAGNOSIS — M5416 Radiculopathy, lumbar region: Secondary | ICD-10-CM

## 2014-05-26 DIAGNOSIS — G8929 Other chronic pain: Secondary | ICD-10-CM

## 2014-05-26 DIAGNOSIS — R030 Elevated blood-pressure reading, without diagnosis of hypertension: Secondary | ICD-10-CM

## 2014-05-26 MED ORDER — GABAPENTIN 300 MG PO CAPS: 300.0000 mg | ORAL_CAPSULE | Freq: Every day | ORAL | Status: DC

## 2014-05-26 MED ORDER — OXYCODONE HCL 30 MG PO TABS: 30.0000 mg | ORAL_TABLET | ORAL | Status: DC | PRN

## 2014-05-26 NOTE — Progress Notes (Signed)
Subjective:    Patient ID: Mike Sosa, male    DOB: 1975-01-31, 39 y.o.   MRN: [id number]  This chart was scribed for Tonye Pearson, MD by Ronney Lion, ED Scribe. This patient was seen in room 9 and the patient's care was started at 6:18 PM.   HPI   Chief Complaint  Patient presents with  . Medication Refill    Needs refill on Oxycodone  . Hernia    Want to have it checked out-denies any pain    HPI Comments: Mike Sosa is a 39 y.o. male who presents to the [facility] complaining of a possible umbilical hernia that he noticed 1 month ago and has been increasing in size since. Patient currently wears a back brace. He feels this offers his abdomen support where the swelling is noticed  We are prescribing pain medicine for his chronic back problems as it was prescribed by his last physician who retired. We are awaiting acceptance at pain management in [place], and they have required an MRI first He was called to schedule an MRI yesterday for his chronic radicular lumbar pain. 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 He has not developed any new work limitations or weakness over the past year. His last several office visits chronicles this problem well  Patient Active Problem List   Diagnosis Date Noted  . Chronic radicular lumbar pain 02/09/2014    Priority: Medium  . Umbilical hernia without obstruction and without gangrene 05/26/2014  . Family hx of colon cancer 03/31/2014  . Family hx of prostate cancer 03/31/2014  . Smoker 02/09/2014  . BMI 31.0-31.9,adult 02/09/2014  . Encounter for chronic pain management 02/09/2014  . Elevated BP 02/09/2014   he has failed to establish a PCP for medical follow-up and I will try once again to establish an appointment for him   Past Medical History  Diagnosis Date  . Depression   . Anxiety     Prior to Admission medications   Medication Sig Start Date End Date Taking?  Authorizing Provider  gabapentin (NEURONTIN) 300 MG capsule Take 1-2 capsules (300-600 mg total) by mouth at bedtime. 05/04/14  Yes Tonye Pearson, MD  oxycodone (ROXICODONE) 30 MG immediate release tablet Take 1 tablet (30 mg total) by mouth every 4 (four) hours as needed for pain. To follow last one month supply 05/04/14  Yes Tonye Pearson, MD    No Known Allergies   Review of Systems  Constitutional: Negative for appetite change, fatigue and unexpected weight change.  Eyes: Negative for visual disturbance.  Respiratory: Negative for shortness of breath.   Cardiovascular: Negative for chest pain and palpitations.  Gastrointestinal: Negative for abdominal pain.  Genitourinary: Negative for difficulty urinating.  Musculoskeletal: Negative for joint swelling and neck pain.  Neurological: Negative for headaches.  Psychiatric/Behavioral: Negative for hallucinations, confusion, sleep disturbance and dysphoric mood.       Objective:   Physical Exam  Constitutional: He is oriented to person, place, and time. He appears well-developed and well-nourished. No distress.  HENT:  Head: Normocephalic.  Eyes: Conjunctivae and EOM are normal. Pupils are equal, round, and reactive to light.  Neck: Normal range of motion. Neck supple.  Cardiovascular: Normal rate.   Pulmonary/Chest: Effort normal.  Abdominal:  Soft bulge at the umbilicus 3 cm diameter nontender and nonincarcerated///no other abdominal masses  Neurological: He is alert and oriented to person, place, and  time. No cranial nerve deficit. Coordination normal.  Psychiatric: He has a normal mood and affect.  Nursing note and vitals reviewed. BP 150/80 mmHg  Pulse 100  Temp(Src) 99.8 F (37.7 C) (Oral)  Ht 6' 0.5" (1.842 m)  Wt 257 lb (116.574 kg)  BMI 34.36 kg/m2  SpO2 98%      Assessment & Plan:  DDD (degenerative disc disease), lumbar Chronic pain syndrome Chronic radicular lumbar pain Encounter for chronic pain  management  He understands that he is in transition with us prescribing medicine only until we can get him enrolled in  chronic pain management or arrange follow-up with an orthopedist who can solve his problem  Elevated BP--he needs follow-up for this and other medical history features///he needs a PCP would like to come to our place/ I will schedule an exam for him provider accepting new patients  Umbilical hernia without obstruction and without gangrene--- he would like to wait on surgical evaluation of this until his work slows down in the fall or winter  Meds ordered this encounter  Medications  . oxycodone (ROXICODONE) 30 MG immediate release tablet    Sig: Take 1 tablet (30 mg total) by mouth every 4 (four) hours as needed for pain. To follow last one month supply given 05/04/14--for filling 06/02/14/or/06/03/14    Dispense:  180 tablet    Refill:  0  . gabapentin (NEURONTIN) 300 MG capsule    Sig: Take 1-2 capsules (300-600 mg total) by mouth at bedtime.    Dispense:  60 capsule    Refill:  0    I have completed the patient encounter in its entirety as documented by the scribe, with editing by me where necessary. Coley Littles P. Merla Richesoolittle, M.D.

## 2014-05-28 ENCOUNTER — Telehealth: Payer: Self-pay

## 2014-05-28 NOTE — Telephone Encounter (Signed)
Bound by the regs--can't do early refills and last rx was the 4th I believe

## 2014-05-28 NOTE — Telephone Encounter (Signed)
Pt called and stated that his pharm would be able to fill his next Rx of oxycodone on Sunday but they are not opened Sunday. He is leaving to go out of town on Monday and would like us to call and OK filling the Rx on Sat so that he can get it before he goes out of town. Dr Merla Richesoolittle, you note says that script would be due to be filled on 5/3 or 5/4. Pt stated that he has never asked to fill one early before and asked if Dr Merla Richesoolittle would OK it?

## 2014-05-29 NOTE — Telephone Encounter (Signed)
lmom to cb. 

## 2014-05-29 NOTE — Telephone Encounter (Signed)
Patient is requesting that Dr. Merla Richesoolittle call him. Patient would not disclose information, he just looking to speak with Merla Richesoolittle.   [phone]

## 2014-06-03 NOTE — Telephone Encounter (Signed)
Pt never called back. It is now after the 4th, so pt should have been able to get Rx filled.

## 2014-06-23 ENCOUNTER — Telehealth: Payer: Self-pay

## 2014-06-23 NOTE — Telephone Encounter (Signed)
Can we put in referral again?

## 2014-06-23 NOTE — Telephone Encounter (Signed)
Patient is requesting Dr Merla Richesoolittle to put in an order for him to have an MRI Lumbar Spine W/O contrast [facility] [place] [phone]). Per patient it was previously ordered but he never had it done and the order has expired. Patients call back number is [phone]

## 2014-06-24 NOTE — Telephone Encounter (Signed)
Spoke to [place] and she will take care of it.

## 2014-06-24 NOTE — Telephone Encounter (Signed)
It looks like the order doesn't expire until 07/04/2015.

## 2014-07-05 ENCOUNTER — Ambulatory Visit (INDEPENDENT_AMBULATORY_CARE_PROVIDER_SITE_OTHER): Payer: BLUE CROSS/BLUE SHIELD | Admitting: Internal Medicine

## 2014-07-05 VITALS — BP 144/86 | HR 93 | Temp 98.4°F | Resp 16 | Ht 73.0 in | Wt 267.4 lb

## 2014-07-05 DIAGNOSIS — G8929 Other chronic pain: Secondary | ICD-10-CM | POA: Diagnosis not present

## 2014-07-05 DIAGNOSIS — M5416 Radiculopathy, lumbar region: Secondary | ICD-10-CM | POA: Diagnosis not present

## 2014-07-05 MED ORDER — OXYCODONE HCL 30 MG PO TABS: 30.0000 mg | ORAL_TABLET | ORAL | Status: DC | PRN

## 2014-07-05 MED ORDER — GABAPENTIN 300 MG PO CAPS: 300.0000 mg | ORAL_CAPSULE | Freq: Every day | ORAL | Status: DC

## 2014-07-05 NOTE — Progress Notes (Signed)
Subjective:  This chart was scribed for Ellamae Siaobert Artyom Stencel, MD by Andrew Auaven Small, ED Scribe. This patient was seen in room 1 and the patient's care was started at 1:13 PM.   Patient ID: Mike Sosa, male    DOB: [birth year], 39 y.o.   MRN: [id number]  HPI   Chief Complaint  Patient presents with  . Medication Refill    gabapentin, oxycodone   HPI Comments: Mike Skainsndre M Hustead is a 39 y.o. male who presents to the [facility] for a medication refill of gabapentin and oxycodone. Pt states he is doing well. He plans on taking a vacation in a couple weeks. Pt is requesting to schedule an MRI of lumbar spine.  Patient Active Problem List   Diagnosis Date Noted  . Umbilical hernia without obstruction and without gangrene 05/26/2014  . Family hx of colon cancer 03/31/2014  . Family hx of prostate cancer 03/31/2014  . Smoker 02/09/2014  . BMI 31.0-31.9,adult 02/09/2014  . Chronic radicular lumbar pain 02/09/2014  . Encounter for chronic pain management 02/09/2014  . Elevated BP 02/09/2014   Past Medical History  Diagnosis Date  . Depression   . Anxiety    History reviewed. No pertinent past surgical history. No Known Allergies Prior to Admission medications   Medication Sig Start Date End Date Taking? Authorizing Provider  gabapentin (NEURONTIN) 300 MG capsule Take 1-2 capsules (300-600 mg total) by mouth at bedtime. 07/05/14  Yes Tonye Pearsonobert P Davan Hark, MD  oxycodone (ROXICODONE) 30 MG immediate release tablet Take 1 tablet (30 mg total) by mouth every 4 (four) hours as needed for pain. 07/05/14  Yes Tonye Pearsonobert P Luvenia Cranford, MD  oxycodone (ROXICODONE) 30 MG immediate release tablet Take 1 tablet (30 mg total) by mouth every 4 (four) hours as needed for pain. For 30 days after signed 07/05/14   Tonye Pearsonobert P Ohanna Gassert, MD   Review of Systems  Objective:   Physical Exam  Constitutional: He is oriented to person, place, and time. He appears well-developed and well-nourished. No distress.  HENT:    Head: Normocephalic and atraumatic.  Eyes: Conjunctivae and EOM are normal.  Neck: Neck supple.  Cardiovascular: Normal rate.   Pulmonary/Chest: Effort normal.  Musculoskeletal: Normal range of motion.  Neurological: He is alert and oriented to person, place, and time.  Skin: Skin is warm and dry.  Psychiatric: He has a normal mood and affect. His behavior is normal.  Nursing note and vitals reviewed.  Assessment & Plan:  Chronic radicular lumbar pain    Meds ordered this encounter  Medications  . gabapentin (NEURONTIN) 300 MG capsule    Sig: Take 1-2 capsules (300-600 mg total) by mouth at bedtime.    Dispense:  60 capsule    Refill:  5  . oxycodone (ROXICODONE) 30 MG immediate release tablet    Sig: Take 1 tablet (30 mg total) by mouth every 4 (four) hours as needed for pain.    Dispense:  180 tablet    Refill:  0  . oxycodone (ROXICODONE) 30 MG immediate release tablet    Sig: Take 1 tablet (30 mg total) by mouth every 4 (four) hours as needed for pain. For 30 days after signed    Dispense:  180 tablet    Refill:  0  MRI Then ortho plan CPE Oct 16 umbil hern fall F/u 2 mos I have completed the patient encounter in its entirety as documented by the scribe, with editing by me where necessary. Molly Maduroobert  P. Laney Pastor, M.D.

## 2014-07-06 ENCOUNTER — Ambulatory Visit: Payer: Self-pay | Admitting: Physician Assistant

## 2014-07-08 ENCOUNTER — Telehealth: Payer: Self-pay | Admitting: Family Medicine

## 2014-07-08 NOTE — Telephone Encounter (Signed)
Patient wants to know if you know about a cream that he can rub on his back for back pain and if so if you can call him in a RX for the cream

## 2014-07-08 NOTE — Telephone Encounter (Signed)
Unsure what this would be

## 2014-07-08 NOTE — Progress Notes (Signed)
Patient will call us back to make his appt Set up PE with a PA at 104 in oct 2016

## 2014-07-09 ENCOUNTER — Telehealth: Payer: Self-pay | Admitting: Family Medicine

## 2014-07-09 ENCOUNTER — Other Ambulatory Visit: Payer: Self-pay | Admitting: Radiology

## 2014-07-09 DIAGNOSIS — M545 Low back pain: Secondary | ICD-10-CM

## 2014-07-09 MED ORDER — DICLOFENAC SODIUM 1 % TD GEL: 2.0000 g | Freq: Four times a day (QID) | TRANSDERMAL | Status: DC

## 2014-07-09 NOTE — Telephone Encounter (Signed)
Ok to call in voltaren gel tho he may not want to pay for it

## 2014-07-09 NOTE — Telephone Encounter (Signed)
Dr. Donn Pierini medicine that the patient is wanting is Voltaren for his back pain

## 2014-07-09 NOTE — Telephone Encounter (Signed)
Rx sent to pharmacy   

## 2014-07-09 NOTE — Telephone Encounter (Signed)
Spoke with pt, advised him to call back to give me the name of the cream.

## 2014-07-09 NOTE — Telephone Encounter (Signed)
Can someone rx this for him? I'm not having success in doing so.

## 2014-07-10 ENCOUNTER — Telehealth: Payer: Self-pay | Admitting: Family Medicine

## 2014-07-10 NOTE — Telephone Encounter (Signed)
Left message Rx sent in. 

## 2014-07-10 NOTE — Telephone Encounter (Signed)
lmom for patient to pick RX up from pharmacist

## 2014-07-14 ENCOUNTER — Telehealth: Payer: Self-pay

## 2014-07-14 NOTE — Telephone Encounter (Signed)
-----   Message -----    From: Cordella Register    Sent: 05/05/2014  2:08 PM     To: Tonye Pearson, MD      Per [facility] patient has already had an MRI of the Lumbar Spine ordered by Dr Christella Hartigan in [facility]. This procedure has been preformed at [facility]. The order was put in the same day you put yours in. I will not beable to obtain authorizations since service has already been authorized by another provider. I have left message with patient letting them know.         MRi of what?

## 2014-07-14 NOTE — Telephone Encounter (Signed)
Pt needs a referral for MRI.

## 2014-07-15 ENCOUNTER — Telehealth: Payer: Self-pay

## 2014-07-15 NOTE — Telephone Encounter (Signed)
PA started for voltaren gel on covermymeds. I don't believe it will be covered for pt's back or hip pain. It is normally only covered by this ins for joint pain from elbows to fingers or knees to feet. But I will send in for review. The form does want the answer to the following question and I can not reach the pt. Dr Merla Riches, do you know the answer to the following?  Please check any that apply to the patient:  1. Has had at least a 1-week trial of a therapeutic dose of an oral NSAID  2. Has experienced intolerable side effects to oral NSAIDs   Or, 3. Has risks for upper gastrointestinal (GI) bleeding, such as one of the following: Age 24 years or greater, history of peptic ulcer disease or ulcer/GI bleeding related to oral NSAIDs, current regimen includes anticoagulant, prescription anti-platelet drug, corticosteroid or DMARD (disease-modifying and anti-rheumatic drug) therapy or hereditary or acquired coagulation defect (e.g., hemophilia, Von Willebrand's disease, protein C or S deficiency, thrombocytopenia or chronic renal failure)

## 2014-07-16 NOTE — Telephone Encounter (Signed)
Spoke with pt, he would like a MRI of his lower back. at [facility]. He would like it scheduled after next week.

## 2014-07-17 NOTE — Telephone Encounter (Signed)
So do we order or does the other order still stand???--have this patient get [place] records from the other provider

## 2014-07-17 NOTE — Telephone Encounter (Signed)
Tried Nsaids without relief

## 2014-07-21 NOTE — Telephone Encounter (Signed)
Completed PA answers on covermymeds. Pending.

## 2014-07-24 NOTE — Telephone Encounter (Signed)
We will let it go

## 2014-07-24 NOTE — Telephone Encounter (Signed)
Notified pharm PA was denied and pt would have to pay cash if he wants this medication.

## 2014-07-24 NOTE — Telephone Encounter (Signed)
PA was denied for voltaren because it is not covered for back or hip pain, only covered for elbow, wrist, hand, knee or ankle OA. Dr Merla Riches, do you want to write for any other meds?

## 2014-08-29 ENCOUNTER — Ambulatory Visit (INDEPENDENT_AMBULATORY_CARE_PROVIDER_SITE_OTHER): Payer: BLUE CROSS/BLUE SHIELD | Admitting: Internal Medicine

## 2014-08-29 VITALS — BP 122/80 | HR 106 | Temp 98.4°F | Resp 18 | Ht 73.0 in | Wt 251.0 lb

## 2014-08-29 DIAGNOSIS — G8929 Other chronic pain: Secondary | ICD-10-CM

## 2014-08-29 DIAGNOSIS — M5416 Radiculopathy, lumbar region: Secondary | ICD-10-CM

## 2014-08-29 MED ORDER — OXYCODONE HCL 30 MG PO TABS: 30.0000 mg | ORAL_TABLET | ORAL | Status: DC | PRN

## 2014-08-29 NOTE — Progress Notes (Signed)
   Subjective:    Patient ID: Mike Sosa, male   Mike [birth year], 39 y.o.   MRN: [id number] This chart was scribed for [name], MD by Jolene Provost, Medical Scribe. This patient was seen in Room 2 and the patient's care was started a 3:38 PM.  Chief Complaint  Patient presents with  . Medication Refill    lasix    HPI HPI Comments: Mike Sosa is a 39 y.o. male who presents to [facility] reporting for a medication refill. Pt states he has not gotten his MRI, which he agreed to do at his last visit because it was scheduled without his knowledge by a pain management clinic, and he missed his appointment.  Just a per Trail is in the notes from referrals. [facility] [employer] refused his MRI because he is scheduled by pain management clinic in [place] already and then they discontinued the appointment scheduled because he stopped going to the clinic.   Review of Systems  Constitutional: Negative for fever and chills.  Musculoskeletal: Positive for back pain.       Objective:   Physical Exam  Constitutional: He is oriented to person, place, and time. He appears well-developed and well-nourished. No distress.  HENT:  Head: Normocephalic and atraumatic.  Eyes: Pupils are equal, round, and reactive to light.  Neck: Neck supple.  Cardiovascular: Normal rate.   Pulmonary/Chest: Effort normal. No respiratory distress.  Musculoskeletal: Normal range of motion.  Neurological: He is alert and oriented to person, place, and time. Coordination normal.  Skin: Skin is warm and dry. He is not diaphoretic.  Psychiatric: He has a normal mood and affect. His behavior is normal.  Nursing note and vitals reviewed.      Assessment & Plan:  Chronic radicular lumbar pain - Plan: MR Lumbar Spine Wo Contrast  Meds ordered this encounter  Medications  . oxycodone (ROXICODONE) 30 MG immediate release tablet    Sig: Take 1 tablet (30 mg total) by mouth every 4 (four) hours as needed for pain.   Dispense:  180 tablet    Refill:  0   The plan continues to be #1 do MRI to [place] disease number to refer to orthopedics for consideration of non-OB UA possibilities #3 use this to decide upon surgery versus chronic pain treatment.(  I have completed the patient encounter in its entirety as documented by the scribe, with editing by me where necessary. Courtni Balash P. Merla Riches, M.D.

## 2014-09-12 ENCOUNTER — Ambulatory Visit
Admission: RE | Admit: 2014-09-12 | Discharge: 2014-09-12 | Disposition: A | Discharge status: Home / Self Care | Payer: BLUE CROSS/BLUE SHIELD | Source: Ambulatory Visit | Attending: Internal Medicine | Admitting: Internal Medicine

## 2014-09-12 DIAGNOSIS — M5416 Radiculopathy, lumbar region: Principal | ICD-10-CM

## 2014-09-12 DIAGNOSIS — G8929 Other chronic pain: Secondary | ICD-10-CM

## 2014-09-28 ENCOUNTER — Ambulatory Visit (INDEPENDENT_AMBULATORY_CARE_PROVIDER_SITE_OTHER): Payer: BLUE CROSS/BLUE SHIELD | Admitting: Internal Medicine

## 2014-09-28 VITALS — BP 146/84 | HR 88 | Temp 98.4°F | Resp 18 | Ht 73.0 in | Wt 270.4 lb

## 2014-09-28 DIAGNOSIS — J22 Unspecified acute lower respiratory infection: Secondary | ICD-10-CM

## 2014-09-28 DIAGNOSIS — R030 Elevated blood-pressure reading, without diagnosis of hypertension: Secondary | ICD-10-CM | POA: Diagnosis not present

## 2014-09-28 DIAGNOSIS — Z720 Tobacco use: Secondary | ICD-10-CM

## 2014-09-28 DIAGNOSIS — J988 Other specified respiratory disorders: Secondary | ICD-10-CM | POA: Diagnosis not present

## 2014-09-28 DIAGNOSIS — G8929 Other chronic pain: Secondary | ICD-10-CM | POA: Diagnosis not present

## 2014-09-28 DIAGNOSIS — F17200 Nicotine dependence, unspecified, uncomplicated: Secondary | ICD-10-CM

## 2014-09-28 DIAGNOSIS — Z8042 Family history of malignant neoplasm of prostate: Secondary | ICD-10-CM

## 2014-09-28 DIAGNOSIS — M5416 Radiculopathy, lumbar region: Secondary | ICD-10-CM | POA: Diagnosis not present

## 2014-09-28 DIAGNOSIS — IMO0001 Reserved for inherently not codable concepts without codable children: Secondary | ICD-10-CM

## 2014-09-28 DIAGNOSIS — Z800 Family history of malignant neoplasm of digestive organs: Secondary | ICD-10-CM | POA: Diagnosis not present

## 2014-09-28 DIAGNOSIS — Z7189 Other specified counseling: Secondary | ICD-10-CM | POA: Diagnosis not present

## 2014-09-28 DIAGNOSIS — J4520 Mild intermittent asthma, uncomplicated: Secondary | ICD-10-CM

## 2014-09-28 MED ORDER — PREDNISONE 20 MG PO TABS: ORAL_TABLET | ORAL | Status: DC

## 2014-09-28 MED ORDER — AZITHROMYCIN 250 MG PO TABS: ORAL_TABLET | ORAL | Status: DC

## 2014-09-28 MED ORDER — OXYCODONE HCL 30 MG PO TABS: 30.0000 mg | ORAL_TABLET | ORAL | Status: DC | PRN

## 2014-09-28 MED ORDER — HYDROCODONE-HOMATROPINE 5-1.5 MG/5ML PO SYRP: 5.0000 mL | ORAL_SOLUTION | Freq: Four times a day (QID) | ORAL | Status: DC | PRN

## 2014-09-28 NOTE — Progress Notes (Signed)
Subjective:    Patient ID: Mike Sosa, male    DOB: [birth year], 39 y.o.   MRN: [id number] This chart was scribed for [name], MD by Jolene Provost, Medical Scribe. This patient was seen in Room 10 and the patient's care was started a 6:51 PM.  Chief Complaint  Patient presents with  . Cough  . Shortness of Breath    All weekend  . chest congestion    all weekend  . unable to sleep    all weekend    HPI HPI Comments: Mike Sosa is a 39 y.o. male who presents to [facility] complaining of cough with associated chest congestion, SOB, and sleep disturbance for the last four days. Pt denies fever or chills. Pt has never used an inhaler in the past. Continues to smoke. Has not followed up for routine care yet but has appt here in Oct. He is supposed to have an appointment 104 for health maintenance issues.  He also needs refill of his pain medication. Attempts to schedule him for further evaluation have been delayed. He came to [place] in January 2016 having ended 6 years of chronic pain management in [place] expecting transfer to another physician. We agreed to give him bridge therapy until he can have a new appointment. You can see the difficulty with scheduling in the clinics in the chart notes. My evaluation suggested that he might have treatable spine issue--he had not been thoroughly evaluated in my opinion. Surgery had been requested 6-8 years ago and he had refused that as an option. 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 He requires pain medicine to continue working. It has taken 6 months to get his insurance to approve a repeat MRI and the results [id number]: Chronic degenerative disc disease at L5-S1 with a small broad-based disc protrusion without neural impingement. Otherwise, essentially normal exam. Indeed this should be curable and we will proceed with orthopedic evaluation.  [facility] says I am only prescriber Review of Systems  Constitutional: Negative for  fever and chills.  HENT: Negative for rhinorrhea, sinus pressure and sore throat.   Respiratory: Positive for cough, chest tightness, shortness of breath and wheezing.        Objective:   Physical Exam  Constitutional: He is oriented to person, place, and time. He appears well-developed and well-nourished. No distress.  HENT:  Head: Normocephalic and atraumatic.  Right Ear: External ear normal.  Left Ear: External ear normal.  Nose: Nose normal.  Mouth/Throat: Oropharynx is clear and moist. No oropharyngeal exudate.  Eyes: Conjunctivae are normal. Pupils are equal, round, and reactive to light.  Neck: Neck supple. No thyromegaly present.  Cardiovascular: Normal rate, regular rhythm and normal heart sounds.   No murmur heard. Pulmonary/Chest: Effort normal. No respiratory distress. He has wheezes.  Rhonchi and wheezing bilaterally especially on or stay expiration  Lymphadenopathy:    He has no cervical adenopathy.  Neurological: He is alert and oriented to person, place, and time. He has normal reflexes. Coordination normal.  Skin: He is not diaphoretic.  Nursing note and vitals reviewed.   Filed Vitals:   09/28/14 1740  BP: 146/84  Pulse: 88  Temp: 98.4 F (36.9 C)  TempSrc: Oral  Resp: 18  Height:  (1.854 m)  Weight: 270 lb 6.4 oz (122.653 kg)  SpO2: 96%       Assessment & Plan:  Chronic radicular lumbar pain--needs full orthopedic evaluation and will refer to [place]  Smoker-advised further  cessation  Family hx of prostate cancer Family hx of colon cancer Elevated BP  To follow-up for complete physical for further evaluation in October  Encounter for chronic pain management  Continue to prescribe medication until final disposition is set for either orthopedic intervention or chronic pain  management   Lower respiratory infection Reactive airway disease, mild intermittent, uncomplicated  Meds ordered this encounter  Medications  . azithromycin  (ZITHROMAX) 250 MG tablet    Sig: As packaged    Dispense:  6 tablet    Refill:  0  . HYDROcodone-homatropine (HYCODAN) 5-1.5 MG/5ML syrup    Sig: Take 5 mLs by mouth every 6 (six) hours as needed.    Dispense:  120 mL    Refill:  0  . predniSONE (DELTASONE) 20 MG tablet    Sig: 4/3/3/2/2/1/1 single daily dose for 6 days    Dispense:  16 tablet    Refill:  0  . oxycodone (ROXICODONE) 30 MG immediate release tablet    Sig: Take 1 tablet (30 mg total) by mouth every 4 (four) hours as needed for pain.    Dispense:  180 tablet    Refill:  0  . oxycodone (ROXICODONE) 30 MG immediate release tablet    Sig: Take 1 tablet (30 mg total) by mouth every 4 (four) hours as needed for pain. For 30 days after signed    Dispense:  180 tablet    Refill:  0   Follow-up 2 months   I have completed the patient encounter in its entirety as documented by the scribe, with editing by me where necessary. Beatrice Ziehm P. Merla Riches, M.D.

## 2014-09-30 ENCOUNTER — Telehealth: Payer: Self-pay

## 2014-09-30 MED ORDER — ALBUTEROL SULFATE HFA 108 (90 BASE) MCG/ACT IN AERS: 2.0000 | INHALATION_SPRAY | Freq: Four times a day (QID) | RESPIRATORY_TRACT | Status: DC | PRN

## 2014-09-30 NOTE — Telephone Encounter (Signed)
Patient called back regarding his request for an inhaler from Dr. Merla Riches.  He states the Dr was to call in one at his previous OV.   [phone] (H)

## 2014-09-30 NOTE — Telephone Encounter (Signed)
Patient was recently seen and was supposed to have been prescribed an inhaler. Patient would like for the inhaler prescription to be sent to [facility] on [address] in [facility]

## 2014-09-30 NOTE — Telephone Encounter (Signed)
Called pt, advised on VM Rx sent.

## 2014-09-30 NOTE — Telephone Encounter (Signed)
Did you mean to Rx an inhaler for pt?

## 2014-09-30 NOTE — Telephone Encounter (Signed)
done

## 2014-10-01 ENCOUNTER — Telehealth: Payer: Self-pay | Admitting: Family Medicine

## 2014-10-01 NOTE — Telephone Encounter (Signed)
Called to set up appt with patient he just want to wait and go to walk in clinic to see someone he can only do weekends for a CPE

## 2014-11-22 ENCOUNTER — Ambulatory Visit (INDEPENDENT_AMBULATORY_CARE_PROVIDER_SITE_OTHER): Payer: BLUE CROSS/BLUE SHIELD | Admitting: Internal Medicine

## 2014-11-22 VITALS — BP 144/82 | HR 102 | Temp 98.3°F | Resp 18 | Ht 73.0 in | Wt 277.0 lb

## 2014-11-22 DIAGNOSIS — R600 Localized edema: Secondary | ICD-10-CM | POA: Diagnosis not present

## 2014-11-22 DIAGNOSIS — Z8042 Family history of malignant neoplasm of prostate: Secondary | ICD-10-CM

## 2014-11-22 DIAGNOSIS — M5416 Radiculopathy, lumbar region: Secondary | ICD-10-CM | POA: Diagnosis not present

## 2014-11-22 DIAGNOSIS — Z720 Tobacco use: Secondary | ICD-10-CM | POA: Diagnosis not present

## 2014-11-22 DIAGNOSIS — R609 Edema, unspecified: Secondary | ICD-10-CM | POA: Insufficient documentation

## 2014-11-22 DIAGNOSIS — Z800 Family history of malignant neoplasm of digestive organs: Secondary | ICD-10-CM

## 2014-11-22 DIAGNOSIS — I10 Essential (primary) hypertension: Secondary | ICD-10-CM | POA: Insufficient documentation

## 2014-11-22 DIAGNOSIS — G8929 Other chronic pain: Secondary | ICD-10-CM | POA: Diagnosis not present

## 2014-11-22 DIAGNOSIS — E669 Obesity, unspecified: Secondary | ICD-10-CM | POA: Diagnosis not present

## 2014-11-22 DIAGNOSIS — F17200 Nicotine dependence, unspecified, uncomplicated: Secondary | ICD-10-CM

## 2014-11-22 LAB — CBC WITH DIFFERENTIAL/PLATELET
Basophils Absolute: 0 10*3/uL (ref 0.0–0.1)
Basophils Relative: 1 % (ref 0–1)
Eosinophils Absolute: 0.1 10*3/uL (ref 0.0–0.7)
Eosinophils Relative: 3 % (ref 0–5)
HEMATOCRIT: 30.7 % — AB (ref 39.0–52.0)
HEMOGLOBIN: 10.5 g/dL — AB (ref 13.0–17.0)
LYMPHS PCT: 26 % (ref 12–46)
Lymphs Abs: 0.6 10*3/uL — ABNORMAL LOW (ref 0.7–4.0)
MCH: 28.5 pg (ref 26.0–34.0)
MCHC: 34.2 g/dL (ref 30.0–36.0)
MCV: 83.4 fL (ref 78.0–100.0)
MONO ABS: 0.3 10*3/uL (ref 0.1–1.0)
MONOS PCT: 13 % — AB (ref 3–12)
NEUTROS ABS: 1.3 10*3/uL — AB (ref 1.7–7.7)
Neutrophils Relative %: 57 % (ref 43–77)
Platelets: 31 10*3/uL — ABNORMAL LOW (ref 150–400)
RBC: 3.68 MIL/uL — AB (ref 4.22–5.81)
RDW: 16.2 % — ABNORMAL HIGH (ref 11.5–15.5)
WBC: 2.2 10*3/uL — ABNORMAL LOW (ref 4.0–10.5)

## 2014-11-22 LAB — COMPREHENSIVE METABOLIC PANEL
ALBUMIN: 2.7 g/dL — AB (ref 3.6–5.1)
ALK PHOS: 100 U/L (ref 40–115)
ALT: 13 U/L (ref 9–46)
AST: 39 U/L (ref 10–40)
BILIRUBIN TOTAL: 1.9 mg/dL — AB (ref 0.2–1.2)
BUN: 11 mg/dL (ref 7–25)
CALCIUM: 7.9 mg/dL — AB (ref 8.6–10.3)
CO2: 30 mmol/L (ref 20–31)
Chloride: 101 mmol/L (ref 98–110)
Creat: 0.75 mg/dL (ref 0.60–1.35)
Glucose, Bld: 137 mg/dL — ABNORMAL HIGH (ref 65–99)
Potassium: 3.7 mmol/L (ref 3.5–5.3)
Sodium: 136 mmol/L (ref 135–146)
Total Protein: 6.3 g/dL (ref 6.1–8.1)

## 2014-11-22 LAB — LIPID PANEL
CHOL/HDL RATIO: 2.4 ratio (ref <=5.0)
Cholesterol: 58 mg/dL — ABNORMAL LOW (ref 125–200)
HDL: 24 mg/dL — ABNORMAL LOW (ref >=40)
LDL CALC: 20 mg/dL (ref <130)
Triglycerides: 69 mg/dL (ref <150)
VLDL: 14 mg/dL (ref <30)

## 2014-11-22 LAB — HEMOGLOBIN A1C
Hgb A1c MFr Bld: 5.1 % (ref <5.7)
MEAN PLASMA GLUCOSE: 100 mg/dL (ref <117)

## 2014-11-22 MED ORDER — CHLORTHALIDONE 25 MG PO TABS: 25.0000 mg | ORAL_TABLET | Freq: Every day | ORAL | Status: DC

## 2014-11-22 MED ORDER — OXYCODONE HCL 30 MG PO TABS: 30.0000 mg | ORAL_TABLET | ORAL | Status: DC | PRN

## 2014-11-22 NOTE — Patient Instructions (Signed)
Referred for colonoscopy We will find out about your orthopedic referral as well We will send you your lab results You have been started on a medicine for your edema which will also bring her blood pressure down but you have to take it every day-chlorthalidone

## 2014-11-22 NOTE — Progress Notes (Addendum)
Subjective:  This chart was scribed for Mike Sia, MD by Andrew Au, ED Scribe. This patient was seen in room 12 and the patient's care was started at 12:03 PM.   Patient ID: Mike Sosa, male    DOB: 1975-11-07, 39 y.o.   MRN: [id number]  HPIsee visits since 1/16 Chief Complaint  Patient presents with  . Medication Refill    Oxycodone    HPI Comments: Mike Sosa is a 39 y.o. male who presents to the [facility] for a medication refill of oxycodone.He had an MRI 09/12/2014 that showed chronic DDD L5-S1 as well as small broad-based disc protrusion without neural impingement. He was referred to [facility] but never received a call to set up an appointment. He has been in chronic pain management for his back problem suffered after a fall on concrete over a decade ago. His lesion on MRI appears to be salvageable with injections or surgery rather than chronic pain management and so trying to establish orthopedic follow-up  Pt's BP is elevated today at 144/82. Currently on Lasix 20 mg. He has noticed leg swelling with working, standing on his feet all day. No SOB with work. No exertional chest pain. Mother also has hx of water retention. He has not followed up for CPE as directed.  Lung infection treated at last visit has cleared.   Family hx of colon cancer in mother in father. Continues to work full-time. Father STILL ALIVE AT 49.  Past Medical History  Diagnosis Date  . Depression   . Anxiety    No Known Allergies Prior to Admission medications   Medication Sig Start Date End Date Taking? Authorizing Provider  gabapentin (NEURONTIN) 300 MG capsule Take 1-2 capsules (300-600 mg total) by mouth at bedtime. 07/05/14  Yes Tonye Pearson, MD  oxycodone (ROXICODONE) 30 MG immediate release tablet Take 1 tablet (30 mg total) by mouth every 4 (four) hours as needed for pain. 09/28/14  Yes Tonye Pearson, MD  oxycodone (ROXICODONE) 30 MG immediate  release tablet Take 1 tablet (30 mg total) by mouth every 4 (four) hours as needed for pain. For 30 days after signed 09/28/14  Yes Tonye Pearson, MD  albuterol (PROVENTIL HFA;VENTOLIN HFA) 108 (90 BASE) MCG/ACT inhaler Inhale 2 puffs into the lungs every 6 (six) hours as needed for wheezing or shortness of breath. Patient not taking: Reported on 11/22/2014 09/30/14   Tonye Pearson, MD  azithromycin [facility]) 250 MG tablet As packaged Patient not taking: Reported on 11/22/2014 09/28/14   Tonye Pearson, MD  diclofenac sodium (VOLTAREN) 1 % GEL Apply 2 g topically 4 (four) times daily. Patient not taking: Reported on 08/29/2014 07/09/14   Dorna Leitz, PA-C  furosemide (LASIX) 20 MG tablet Take 20 mg by mouth daily. Takes 1-2 tablets    Historical Provider, MD  HYDROcodone-homatropine (HYCODAN) 5-1.5 MG/5ML syrup Take 5 mLs by mouth every 6 (six) hours as needed. Patient not taking: Reported on 11/22/2014 09/28/14   Tonye Pearson, MD  predniSONE (DELTASONE) 20 MG tablet 4/3/3/2/2/1/1 single daily dose for 6 days Patient not taking: Reported on 11/22/2014 09/28/14   Tonye Pearson, MD   Review of Systems     Objective:   Physical Exam  Constitutional: He is oriented to person, place, and time. He appears well-developed and well-nourished. No distress.  Obesity  HENT:  Head: Normocephalic and atraumatic.  Eyes: Conjunctivae and EOM are normal. Pupils are  equal, round, and reactive to light.  Neck: Neck supple. No thyromegaly present.  Cardiovascular: Normal rate, regular rhythm, normal heart sounds and intact distal pulses.   No murmur heard. Pulmonary/Chest: Effort normal and breath sounds normal. He has no wheezes.  Musculoskeletal: Normal range of motion. He exhibits edema.  2+ pitting LE bilat  Lymphadenopathy:    He has no cervical adenopathy.  Neurological: He is alert and oriented to person, place, and time. No cranial nerve deficit.  Skin: Skin is warm and dry.    Psychiatric: He has a normal mood and affect. His behavior is normal.  Nursing note and vitals reviewed.   Filed Vitals:   11/22/14 1200  BP: 144/82  Pulse: 102  Temp: 98.3 F (36.8 C)  Resp: 18  Height: 6\' 1"  (1.854 m)  Weight: 277 lb (125.646 kg)  SpO2: 95%   BP Readings from Last 3 Encounters:  11/22/14 144/82  09/28/14 146/84  08/29/14 122/80    Assessment & Plan:    1. Chronic radicular lumbar pain   2. Essential hypertension   3. Smoker   4. Family hx of prostate cancer   5. Family hx of colon cancer   6. Localized edema   7. Obesity    -Refer for colonoscopy -Consider Tdap at next visit -Try again for orthopedic evaluation -Routine labs including A1c -Continue chronic pain medication until a disposition is reached -He is down to 6 cigarettes a day but needs further smoking cessation -Start chlorthalidone 25 mg  Meds ordered this encounter  Medications  . oxycodone (ROXICODONE) 30 MG immediate release tablet    Sig: Take 1 tablet (30 mg total) by mouth every 4 (four) hours as needed for pain.    Dispense:  180 tablet    Refill:  0  . oxycodone (ROXICODONE) 30 MG immediate release tablet    Sig: Take 1 tablet (30 mg total) by mouth every 4 (four) hours as needed for pain. For 30 days after signed    Dispense:  180 tablet    Refill:  0  . chlorthalidone (HYGROTON) 25 MG tablet    Sig: Take 1 tablet (25 mg total) by mouth daily.    Dispense:  90 tablet    Refill:  3  f/u 2mos Call re labs Ref colonosc  By signing my name below, I, [name], attest that this documentation has been prepared under the direction and in the presence of Mike Siaobert Michell Giuliano, MD.  Electronically Signed: Andrew Auaven Small, ED Scribe. 11/20/2014. 12:34 PM.   I have completed the patient encounter in its entirety as documented by the scribe, with editing by me where necessary. Toniqua Melamed P. Merla Richesoolittle, M.D.

## 2014-11-23 ENCOUNTER — Telehealth: Payer: Self-pay

## 2014-11-23 LAB — PSA: PSA: 0.16 ng/mL (ref <=4.00)

## 2014-11-23 NOTE — Telephone Encounter (Signed)
Lab Results  Component Value Date   WBC 2.2* 11/22/2014   HGB 10.5* 11/22/2014   HCT 30.7* 11/22/2014   MCV 83.4 11/22/2014   PLT 31* 11/22/2014   Needs repeat to document pancytopenia Has hypocalcemia as well so repeat Ca, Tot Prot and albumin plus adding Phos  he needs to do this today or tonight

## 2014-11-23 NOTE — Telephone Encounter (Signed)
[facility] called to alert low platelet count at 31.

## 2014-11-23 NOTE — Telephone Encounter (Signed)
Called pt mailbox is full

## 2014-11-23 NOTE — Telephone Encounter (Signed)
Tried to call Pt. But was unable to reach him, left message for him to call us first thing in the AM.   Dr. Merla Richesoolittle wanted him to come in tomorrow (11/24/2014) for a re-check of his blood count, because the levels were abnormal.  Dr. Manon Hildingollite will be closing that day if he wanted to come when he was here otherwise he can come whenever

## 2014-11-24 NOTE — Telephone Encounter (Signed)
Spoke with pt. He is out of town. Will come in for a recheck when he gets back in. Gave him precautions about being careful with his low platelet count

## 2014-12-28 ENCOUNTER — Encounter: Payer: Self-pay | Admitting: Internal Medicine

## 2015-01-18 ENCOUNTER — Ambulatory Visit (INDEPENDENT_AMBULATORY_CARE_PROVIDER_SITE_OTHER): Payer: BLUE CROSS/BLUE SHIELD | Admitting: Internal Medicine

## 2015-01-18 VITALS — BP 144/90 | HR 114 | Temp 98.7°F | Resp 16 | Ht 72.5 in | Wt 273.5 lb

## 2015-01-18 DIAGNOSIS — R188 Other ascites: Secondary | ICD-10-CM | POA: Diagnosis not present

## 2015-01-18 DIAGNOSIS — I10 Essential (primary) hypertension: Secondary | ICD-10-CM | POA: Diagnosis not present

## 2015-01-18 DIAGNOSIS — K429 Umbilical hernia without obstruction or gangrene: Secondary | ICD-10-CM | POA: Diagnosis not present

## 2015-01-18 DIAGNOSIS — D649 Anemia, unspecified: Secondary | ICD-10-CM

## 2015-01-18 DIAGNOSIS — D709 Neutropenia, unspecified: Secondary | ICD-10-CM | POA: Diagnosis not present

## 2015-01-18 DIAGNOSIS — E8809 Other disorders of plasma-protein metabolism, not elsewhere classified: Secondary | ICD-10-CM | POA: Diagnosis not present

## 2015-01-18 DIAGNOSIS — Z6831 Body mass index (BMI) 31.0-31.9, adult: Secondary | ICD-10-CM | POA: Diagnosis not present

## 2015-01-18 DIAGNOSIS — R10811 Right upper quadrant abdominal tenderness: Secondary | ICD-10-CM

## 2015-01-18 DIAGNOSIS — D696 Thrombocytopenia, unspecified: Secondary | ICD-10-CM

## 2015-01-18 DIAGNOSIS — Z7189 Other specified counseling: Secondary | ICD-10-CM | POA: Diagnosis not present

## 2015-01-18 DIAGNOSIS — R600 Localized edema: Secondary | ICD-10-CM | POA: Diagnosis not present

## 2015-01-18 DIAGNOSIS — G8929 Other chronic pain: Secondary | ICD-10-CM

## 2015-01-18 DIAGNOSIS — R140 Abdominal distension (gaseous): Secondary | ICD-10-CM

## 2015-01-18 LAB — POCT CBC
GRANULOCYTE PERCENT: 70.5 % (ref 37–80)
HCT, POC: 37.5 % — AB (ref 43.5–53.7)
HEMOGLOBIN: 13 g/dL — AB (ref 14.1–18.1)
Lymph, poc: 0.8 (ref 0.6–3.4)
MCH: 29.1 pg (ref 27–31.2)
MCHC: 34.6 g/dL (ref 31.8–35.4)
MCV: 84 fL (ref 80–97)
MID (cbc): 0.5 (ref 0–0.9)
MPV: 7.9 fL (ref 0–99.8)
PLATELET COUNT, POC: 21 10*3/uL — AB (ref 142–424)
POC Granulocyte: 3 (ref 2–6.9)
POC LYMPH PERCENT: 18.2 %L (ref 10–50)
POC MID %: 11.3 %M (ref 0–12)
RBC: 4.47 M/uL — AB (ref 4.69–6.13)
RDW, POC: 17.1 %
WBC: 4.3 10*3/uL — AB (ref 4.6–10.2)

## 2015-01-18 MED ORDER — GABAPENTIN 300 MG PO CAPS: 300.0000 mg | ORAL_CAPSULE | Freq: Every day | ORAL | Status: DC

## 2015-01-18 MED ORDER — OXYCODONE HCL 30 MG PO TABS: 30.0000 mg | ORAL_TABLET | ORAL | Status: DC | PRN

## 2015-01-18 MED ORDER — FUROSEMIDE 20 MG PO TABS: 20.0000 mg | ORAL_TABLET | Freq: Every day | ORAL | Status: DC

## 2015-01-18 NOTE — Progress Notes (Addendum)
Subjective:  By signing my name below, I, [name], attest that this documentation has been prepared under the direction and in the presence of [name], MD.  Electronically Signed: Andrew Au, ED Scribe. 01/18/2015. 6:19 PM.   Patient ID: Mike Sosa, male    DOB: 06/14/1975, 39 y.o.   MRN: [id number]  HPI he did not return as emergency as recommended due to labs at 11/22/14 OV Patient Active Problem List   Diagnosis Date Noted  . Chronic radicular lumbar pain 02/09/2014    Priority: Medium  . HTN (hypertension) 11/22/2014  . Edema 11/22/2014  . Umbilical hernia without obstruction and without gangrene 05/26/2014  . Family hx of colon cancer 03/31/2014  . Family hx of prostate cancer 03/31/2014  . Smoker 02/09/2014  . BMI 31.0-31.9,adult 02/09/2014  . Encounter for chronic pain management 02/09/2014  . Elevated BP 02/09/2014  he lost pain management and came to [place] 1/16--see numerous attempts to set him up with CPE by appt, MRI, Ortho eval as he may not need prolonged pain meds, and eval for Chronic pain managemnt. He has not followed thru for the reasons noted. In October I insisted on going ahead with medical eval in the walkin clinic as we were not getting things done. He had pancytopenia and was asked to return immediately. 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  Chief Complaint  Patient presents with  . Medication Refill  . Lab work  . Surgical Referral    Hernia   HPI Comments: Mike Sosa is a 39 y.o. male who presents to the [facility] for a follow up.  Since the October visit he has had  Worsening of leg edema  and no response to chlorthalidone.  No SOB or Chest pain. He is also requesting a referral for surgery for umbilical hernia.  His abdomen feels tight and belly button poking out-no pain or redness.  This has not affected appetite and he is not gaining or losing  weight. No constipation or diarrhea.  He had elevated liver function in 2009 with AST at 135 and ALT at 164. [place] pos gallstone and resolved withoiut treatment. No repeat liver function since. He denies hx of liver injury. Denies etoh use. Denies expos to liver toxins at work.   No urinary symptoms including dysuria and frequency.  Colonoscopy: scheduled 1/25 at oct appt  Patient Active Problem List   Diagnosis Date Noted  . HTN (hypertension) 11/22/2014  . Edema 11/22/2014  . Umbilical hernia without obstruction and without gangrene 05/26/2014  . Family hx of colon cancer 03/31/2014  . Family hx of prostate cancer 03/31/2014  . Smoker 02/09/2014  . BMI 31.0-31.9,adult 02/09/2014  . Chronic radicular lumbar pain 02/09/2014  . Encounter for chronic pain management 02/09/2014  . Elevated BP 02/09/2014   Past Medical History  Diagnosis Date  . Depression   . Anxiety    History reviewed. No pertinent past surgical history. No Known Allergies Prior to Admission medications   Medication Sig Start Date End Date Taking? Authorizing Provider  chlorthalidone (HYGROTON) 25 MG tablet Take 1 tablet (25 mg total) by mouth daily. 11/22/14  Yes Tonye Pearson, MD  gabapentin (NEURONTIN) 300 MG capsule Take 1-2 capsules (300-600 mg total) by mouth at bedtime. 07/05/14  Yes Tonye Pearson, MD  oxycodone (ROXICODONE) 30 MG immediate release tablet Take  1 tablet (30 mg total) by mouth every 4 (four) hours as needed for pain. 11/22/14  Yes Tonye Pearsonobert P Marilee Ditommaso, MD   Social History--has partner and kids    Occupational History  . Not on file.   Social History Main Topics  . Smoking status: Current Every Day Smoker -- 0.50 packs/day for 23 years    Types: Cigarettes  . Smokeless tobacco: Never Used  . Alcohol Use: No  . Drug Use: No  . Sexual Activity: Not on file     Review of Systems  Constitutional: Negative for fever, chills, activity change, appetite change, fatigue and unexpected  weight change.  Respiratory: Negative for cough, shortness of breath and wheezing.   Cardiovascular: Positive for leg swelling. Negative for chest pain and palpitations.  Gastrointestinal: Positive for abdominal distention. Negative for nausea, vomiting, abdominal pain, diarrhea and constipation.  Genitourinary: Negative for dysuria, frequency and difficulty urinating.  Musculoskeletal: Positive for back pain.   Objective:   Physical Exam  Constitutional: He is oriented to person, place, and time. He appears well-developed and well-nourished. No distress.  HENT:  Head: Normocephalic and atraumatic.  Eyes: Conjunctivae and EOM are normal. Pupils are equal, round, and reactive to light. No scleral icterus.  Neck: Neck supple. No thyromegaly present.  Cardiovascular: Normal rate, regular rhythm, normal heart sounds and intact distal pulses.   No murmur heard. Rate 80  Pulmonary/Chest: Effort normal and breath sounds normal. He has no wheezes. He has no rales.  Abdominal: He exhibits distension. There is no rebound.  With positive percussion wave. Prominent umbilical hernia. Mild tenderness to percussion RUQ but can't palpate organs or masses  Musculoskeletal: Normal range of motion. He exhibits edema.  2-3+ pitting edema LE.Bilat  Lymphadenopathy:    He has no cervical adenopathy.  Neurological: He is alert and oriented to person, place, and time. No cranial nerve deficit.  Skin: Skin is warm and dry.  Psychiatric: He has a normal mood and affect. His behavior is normal. Thought content normal.  Nursing note and vitals reviewed.  Filed Vitals:   01/18/15 1751  BP: 144/90  Pulse: 114----80 on recheck in exam  Temp: 98.7 F (37.1 C)  TempSrc: Oral  Resp: 16  Height: 6' 0.5" (1.842 m)  Weight: 273 lb 8 oz (124.059 kg)  SpO2: 96%   Results for orders placed or performed in visit on 01/18/15  POCT CBC  Result Value Ref Range   WBC 4.3 (A) 4.6 - 10.2 K/uL   Lymph, poc 0.8 0.6 -  3.4   POC LYMPH PERCENT 18.2 10 - 50 %L   MID (cbc) 0.5 0 - 0.9   POC MID % 11.3 0 - 12 %M   POC Granulocyte 3.0 2 - 6.9   Granulocyte percent 70.5 37 - 80 %G   RBC 4.47 (A) 4.69 - 6.13 M/uL   Hemoglobin 13.0 (A) 14.1 - 18.1 g/dL   HCT, POC 16.137.5 (A) 09.643.5 - 53.7 %   MCV 84.0 80 - 97 fL   MCH, POC 29.1 27 - 31.2 pg   MCHC 34.6 31.8 - 35.4 g/dL   RDW, POC 04.517.1 %   Platelet Count, POC 21 (A) 142 - 424 K/uL   MPV 7.9 0 - 99.8 fL   Assessment & Plan:   1. Ascites   2. Umbilical hernia without obstruction and without gangrene   3. Essential hypertension   4. Encounter for chronic pain management   5. Lower extr edema   6.  BMI 31.0-31.9,adult   7. Hypocalcemia 10/16  8. Hypoalbuminemia 10/16 and low cholesterol   9. Neutropenia, unspecified type (HCC)   10. Anemia, unspecified anemia type   11. Thrombocytopenia (HCC) --not acute   We discussed the possibilities including advanced cirrhosis, hypothyroidism at the best and liver Ca, Colon with liver mets or lymphoma at the worst and he understands the urgency and commits to going forward  Orders Placed This Encounter  Procedures  . CT Abdomen Pelvis W Contrast in am Then consider paracentesis based on results  . Comprehensive metabolic panel  . TSH    Meds ordered this encounter  Medications  . gabapentin (NEURONTIN) 300 MG capsule    Sig: Take 1-2 capsules (300-600 mg total) by mouth at bedtime.    Dispense:  60 capsule    Refill:  5  . oxycodone (ROXICODONE) 30 MG immediate release tablet    Sig: Take 1 tablet (30 mg total) by mouth every 4 (four) hours as needed for pain.    Dispense:  180 tablet    Refill:  0  . furosemide (LASIX) 20 MG tablet    Sig: Take 1 tablet (20 mg total) by mouth daily.    Dispense:  30 tablet    Refill:  0    I have completed the patient encounter in its entirety as documented by the scribe, with editing by me where necessary. Marjoria Mancillas P. Merla Riches, M.D.   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

## 2015-01-19 ENCOUNTER — Encounter: Payer: Self-pay | Admitting: Internal Medicine

## 2015-01-19 ENCOUNTER — Other Ambulatory Visit: Payer: Self-pay | Admitting: Internal Medicine

## 2015-01-19 ENCOUNTER — Ambulatory Visit (HOSPITAL_BASED_OUTPATIENT_CLINIC_OR_DEPARTMENT_OTHER)
Admission: RE | Admit: 2015-01-19 | Discharge: 2015-01-19 | Disposition: A | Discharge status: Home / Self Care | Payer: BLUE CROSS/BLUE SHIELD | Source: Ambulatory Visit | Attending: Internal Medicine | Admitting: Internal Medicine

## 2015-01-19 ENCOUNTER — Telehealth: Payer: Self-pay | Admitting: Internal Medicine

## 2015-01-19 DIAGNOSIS — K7460 Unspecified cirrhosis of liver: Secondary | ICD-10-CM | POA: Diagnosis not present

## 2015-01-19 DIAGNOSIS — R188 Other ascites: Secondary | ICD-10-CM

## 2015-01-19 DIAGNOSIS — R161 Splenomegaly, not elsewhere classified: Secondary | ICD-10-CM | POA: Diagnosis not present

## 2015-01-19 DIAGNOSIS — K5900 Constipation, unspecified: Secondary | ICD-10-CM | POA: Insufficient documentation

## 2015-01-19 LAB — COMPREHENSIVE METABOLIC PANEL
ALBUMIN: 3.2 g/dL — AB (ref 3.6–5.1)
ALK PHOS: 83 U/L (ref 40–115)
ALT: 20 U/L (ref 9–46)
AST: 47 U/L — AB (ref 10–40)
BILIRUBIN TOTAL: 4.5 mg/dL — AB (ref 0.2–1.2)
BUN: 10 mg/dL (ref 7–25)
CALCIUM: 8.1 mg/dL — AB (ref 8.6–10.3)
CO2: 32 mmol/L — AB (ref 20–31)
CREATININE: 0.79 mg/dL (ref 0.60–1.35)
Chloride: 94 mmol/L — ABNORMAL LOW (ref 98–110)
GLUCOSE: 109 mg/dL — AB (ref 65–99)
Potassium: 2.9 mmol/L — ABNORMAL LOW (ref 3.5–5.3)
SODIUM: 134 mmol/L — AB (ref 135–146)
Total Protein: 6.9 g/dL (ref 6.1–8.1)

## 2015-01-19 LAB — TSH: TSH: 2.978 u[IU]/mL (ref 0.350–4.500)

## 2015-01-19 MED ORDER — POTASSIUM CHLORIDE CRYS ER 20 MEQ PO TBCR: 20.0000 meq | EXTENDED_RELEASE_TABLET | Freq: Every day | ORAL | Status: DC

## 2015-01-19 MED ORDER — IOHEXOL 300 MG/ML  SOLN: 100.0000 mL | Freq: Once | INTRAMUSCULAR | Status: DC | PRN

## 2015-01-19 MED ORDER — SPIRONOLACTONE 100 MG PO TABS: 100.0000 mg | ORAL_TABLET | Freq: Every day | ORAL | Status: AC

## 2015-01-19 NOTE — Telephone Encounter (Signed)
Discussed diag meds sent earlier Off chlori\thal and on lasix plus spironolactone and KCL for now with ref to GI and ?paracentesis next

## 2015-01-19 NOTE — Addendum Note (Signed)
Addended by: Tonye PearsonOLITTLE, Lakevia Perris P on: 01/19/2015 05:16 PM   Modules accepted: Orders

## 2015-01-19 NOTE — Addendum Note (Signed)
Addended by: Eddie CandleLUCK, Vence Lalor L on: 01/19/2015 08:52 AM   Modules accepted: Orders

## 2015-01-20 ENCOUNTER — Telehealth: Payer: Self-pay | Admitting: Internal Medicine

## 2015-01-21 NOTE — Telephone Encounter (Signed)
Pt would like for Dr. Merla Richesoolittle to give him a phone call back; he did not give me anymore details.

## 2015-01-25 NOTE — Telephone Encounter (Signed)
Advised pt to come in to be seen today.

## 2015-01-26 ENCOUNTER — Inpatient Hospital Stay (HOSPITAL_COMMUNITY): Payer: BLUE CROSS/BLUE SHIELD

## 2015-01-26 ENCOUNTER — Inpatient Hospital Stay (HOSPITAL_COMMUNITY)
Admission: AD | Admit: 2015-01-26 | Discharge: 2015-01-28 | DRG: 433 | Disposition: A | Discharge status: Home / Self Care | Payer: BLUE CROSS/BLUE SHIELD | Source: Ambulatory Visit | Attending: Family Medicine | Admitting: Family Medicine

## 2015-01-26 ENCOUNTER — Ambulatory Visit (INDEPENDENT_AMBULATORY_CARE_PROVIDER_SITE_OTHER): Payer: BLUE CROSS/BLUE SHIELD | Admitting: Emergency Medicine

## 2015-01-26 VITALS — BP 140/80 | HR 94 | Temp 98.0°F | Resp 20 | Ht 72.0 in | Wt 274.0 lb

## 2015-01-26 DIAGNOSIS — R188 Other ascites: Secondary | ICD-10-CM

## 2015-01-26 DIAGNOSIS — R140 Abdominal distension (gaseous): Secondary | ICD-10-CM | POA: Diagnosis not present

## 2015-01-26 DIAGNOSIS — D696 Thrombocytopenia, unspecified: Secondary | ICD-10-CM | POA: Diagnosis present

## 2015-01-26 DIAGNOSIS — F17210 Nicotine dependence, cigarettes, uncomplicated: Secondary | ICD-10-CM | POA: Diagnosis present

## 2015-01-26 DIAGNOSIS — M545 Low back pain: Secondary | ICD-10-CM | POA: Diagnosis present

## 2015-01-26 DIAGNOSIS — G8929 Other chronic pain: Secondary | ICD-10-CM | POA: Diagnosis present

## 2015-01-26 DIAGNOSIS — R768 Other specified abnormal immunological findings in serum: Secondary | ICD-10-CM | POA: Insufficient documentation

## 2015-01-26 DIAGNOSIS — D638 Anemia in other chronic diseases classified elsewhere: Secondary | ICD-10-CM | POA: Diagnosis present

## 2015-01-26 DIAGNOSIS — I10 Essential (primary) hypertension: Secondary | ICD-10-CM | POA: Diagnosis present

## 2015-01-26 DIAGNOSIS — B1920 Unspecified viral hepatitis C without hepatic coma: Secondary | ICD-10-CM | POA: Diagnosis present

## 2015-01-26 DIAGNOSIS — E8809 Other disorders of plasma-protein metabolism, not elsewhere classified: Secondary | ICD-10-CM | POA: Diagnosis present

## 2015-01-26 DIAGNOSIS — Z720 Tobacco use: Secondary | ICD-10-CM | POA: Insufficient documentation

## 2015-01-26 DIAGNOSIS — R894 Abnormal immunological findings in specimens from other organs, systems and tissues: Secondary | ICD-10-CM | POA: Diagnosis not present

## 2015-01-26 DIAGNOSIS — R161 Splenomegaly, not elsewhere classified: Secondary | ICD-10-CM | POA: Diagnosis present

## 2015-01-26 DIAGNOSIS — D684 Acquired coagulation factor deficiency: Secondary | ICD-10-CM | POA: Diagnosis present

## 2015-01-26 DIAGNOSIS — K766 Portal hypertension: Secondary | ICD-10-CM | POA: Diagnosis present

## 2015-01-26 DIAGNOSIS — K7460 Unspecified cirrhosis of liver: Principal | ICD-10-CM | POA: Diagnosis present

## 2015-01-26 LAB — PROTIME-INR
INR: 1.68 — AB (ref 0.00–1.49)
PROTHROMBIN TIME: 19.8 s — AB (ref 11.6–15.2)

## 2015-01-26 LAB — URINALYSIS, ROUTINE W REFLEX MICROSCOPIC
BILIRUBIN URINE: NEGATIVE
GLUCOSE, UA: NEGATIVE mg/dL
KETONES UR: NEGATIVE mg/dL
LEUKOCYTES UA: NEGATIVE
NITRITE: NEGATIVE
PROTEIN: NEGATIVE mg/dL
Specific Gravity, Urine: 1.01 (ref 1.005–1.030)
pH: 7.5 (ref 5.0–8.0)

## 2015-01-26 LAB — RETICULOCYTES
RBC.: 3.71 MIL/uL — AB (ref 4.22–5.81)
RETIC CT PCT: 3.1 % (ref 0.4–3.1)
Retic Count, Absolute: 115 10*3/uL (ref 19.0–186.0)

## 2015-01-26 LAB — TYPE AND SCREEN
ABO/RH(D): A NEG
Antibody Screen: NEGATIVE

## 2015-01-26 LAB — CBC WITH DIFFERENTIAL/PLATELET
BASOS PCT: 1 %
Basophils Absolute: 0 10*3/uL (ref 0.0–0.1)
EOS ABS: 0.1 10*3/uL (ref 0.0–0.7)
EOS PCT: 2 %
HCT: 31.5 % — ABNORMAL LOW (ref 39.0–52.0)
Hemoglobin: 11 g/dL — ABNORMAL LOW (ref 13.0–17.0)
Lymphocytes Relative: 18 %
Lymphs Abs: 0.7 10*3/uL (ref 0.7–4.0)
MCH: 29.6 pg (ref 26.0–34.0)
MCHC: 34.9 g/dL (ref 30.0–36.0)
MCV: 84.9 fL (ref 78.0–100.0)
MONO ABS: 0.5 10*3/uL (ref 0.1–1.0)
MONOS PCT: 13 %
Neutro Abs: 2.7 10*3/uL (ref 1.7–7.7)
Neutrophils Relative %: 67 %
Platelets: 28 10*3/uL — CL (ref 150–400)
RBC: 3.71 MIL/uL — ABNORMAL LOW (ref 4.22–5.81)
RDW: 17.1 % — AB (ref 11.5–15.5)
WBC: 4.1 10*3/uL (ref 4.0–10.5)

## 2015-01-26 LAB — RAPID URINE DRUG SCREEN, HOSP PERFORMED
Amphetamines: NOT DETECTED
BARBITURATES: NOT DETECTED
Benzodiazepines: NOT DETECTED
Cocaine: NOT DETECTED
Opiates: POSITIVE — AB
TETRAHYDROCANNABINOL: NOT DETECTED

## 2015-01-26 LAB — URINE MICROSCOPIC-ADD ON: WBC UA: NONE SEEN WBC/hpf (ref 0–5)

## 2015-01-26 LAB — COMPREHENSIVE METABOLIC PANEL
ALT: 20 U/L (ref 17–63)
AST: 47 U/L — AB (ref 15–41)
Albumin: 2.8 g/dL — ABNORMAL LOW (ref 3.5–5.0)
Alkaline Phosphatase: 94 U/L (ref 38–126)
Anion gap: 8 (ref 5–15)
BILIRUBIN TOTAL: 2.4 mg/dL — AB (ref 0.3–1.2)
BUN: 15 mg/dL (ref 6–20)
CALCIUM: 8.4 mg/dL — AB (ref 8.9–10.3)
CO2: 29 mmol/L (ref 22–32)
CREATININE: 1.08 mg/dL (ref 0.61–1.24)
Chloride: 97 mmol/L — ABNORMAL LOW (ref 101–111)
Glucose, Bld: 114 mg/dL — ABNORMAL HIGH (ref 65–99)
Potassium: 3.5 mmol/L (ref 3.5–5.1)
Sodium: 134 mmol/L — ABNORMAL LOW (ref 135–145)
TOTAL PROTEIN: 6.3 g/dL — AB (ref 6.5–8.1)

## 2015-01-26 LAB — MAGNESIUM: Magnesium: 1.6 mg/dL — ABNORMAL LOW (ref 1.7–2.4)

## 2015-01-26 LAB — GAMMA GT: GGT: 26 U/L (ref 7–50)

## 2015-01-26 LAB — ABO/RH: ABO/RH(D): A NEG

## 2015-01-26 LAB — APTT: APTT: 36 s (ref 24–37)

## 2015-01-26 LAB — PHOSPHORUS: Phosphorus: 3.2 mg/dL (ref 2.5–4.6)

## 2015-01-26 MED ORDER — SODIUM CHLORIDE 0.9 % IV SOLN: 250.0000 mL | INTRAVENOUS | Status: DC | PRN

## 2015-01-26 MED ORDER — OXYCODONE HCL 5 MG PO TABS
30.0000 mg | ORAL_TABLET | ORAL | Status: DC | PRN
  Administered 2015-01-26 – 2015-01-28 (×10): 30 mg via ORAL
  Filled 2015-01-26 (×12): qty 6

## 2015-01-26 MED ORDER — NICOTINE 14 MG/24HR TD PT24
14.0000 mg | MEDICATED_PATCH | Freq: Every day | TRANSDERMAL | Status: DC
  Administered 2015-01-26 – 2015-01-28 (×3): 14 mg via TRANSDERMAL
  Filled 2015-01-26 (×3): qty 1

## 2015-01-26 MED ORDER — FUROSEMIDE 10 MG/ML IJ SOLN
20.0000 mg | Freq: Two times a day (BID) | INTRAMUSCULAR | Status: DC
  Administered 2015-01-26 – 2015-01-28 (×4): 20 mg via INTRAVENOUS
  Filled 2015-01-26 (×4): qty 2

## 2015-01-26 MED ORDER — SODIUM CHLORIDE 0.9 % IJ SOLN
3.0000 mL | Freq: Two times a day (BID) | INTRAMUSCULAR | Status: DC
  Administered 2015-01-27 – 2015-01-28 (×2): 3 mL via INTRAVENOUS

## 2015-01-26 MED ORDER — SODIUM CHLORIDE 0.9 % IJ SOLN: 3.0000 mL | INTRAMUSCULAR | Status: DC | PRN

## 2015-01-26 MED ORDER — GABAPENTIN 300 MG PO CAPS
300.0000 mg | ORAL_CAPSULE | Freq: Every day | ORAL | Status: DC
  Administered 2015-01-26 – 2015-01-27 (×2): 300 mg via ORAL
  Filled 2015-01-26 (×2): qty 1

## 2015-01-26 NOTE — Patient Instructions (Signed)
Ascites °Ascites is a collection of excess fluid in the abdomen. Ascites can range from mild to severe. It can get worse without treatment. °CAUSES °Possible causes include: °· Cirrhosis. This is the most common cause of ascites. °· Infection or inflammation in the abdomen. °· Cancer in the abdomen. °· Heart failure. °· Kidney disease. °· Inflammation of the pancreas. °· Clots in the veins of the liver. °SIGNS AND SYMPTOMS °Signs and symptoms may include: °· A feeling of fullness in your abdomen. This is common. °· An increase in the size of your abdomen or your waist. °· Swelling in your legs. °· Swelling of the scrotum in men. °· Difficulty breathing. °· Abdominal pain. °· Sudden weight gain. °If the condition is mild, you may not have symptoms. °DIAGNOSIS °To make a diagnosis, your health care provider will: °· Ask about your medical history. °· Perform a physical exam. °· Order imaging tests, such as an ultrasound or CT scan of your abdomen. °TREATMENT °Treatment depends on the cause of the ascites. It may include: °· Taking a pill to make you urinate. This is called a water pill (diuretic pill). °· Strictly reducing your salt (sodium) intake. Salt can cause extra fluid to be kept in the body, and this makes ascites worse. °· Having a procedure to remove fluid from your abdomen (paracentesis). °· Having a procedure to transfer fluid from your abdomen into a vein. °· Having a procedure that connects two of the major veins within your liver and relieves pressure on your liver (TIPS procedure). °Ascites may go away or improve with treatment of the condition that caused it.  °HOME CARE INSTRUCTIONS °· Keep track of your weight. To do this, weigh yourself at the same time every day and record your weight. °· Keep track of how much you drink and any changes in the amount you urinate. °· Follow any instructions that your health care provider gives you about how much to drink. °· Try not to eat salty (high-sodium)  foods. °· Take medicines only as directed by your health care provider. °· Keep all follow-up visits as directed by your health care provider. This is important. °· Report any changes in your health to your health care provider, especially if you develop new symptoms or your symptoms get worse. °SEEK MEDICAL CARE IF: °· Your gain more than 3 pounds in 3 days. °· Your abdominal size or your waist size increases. °· You have new swelling in your legs. °· The swelling in your legs gets worse. °SEEK IMMEDIATE MEDICAL CARE IF: °· You develop a fever. °· You develop confusion. °· You develop new or worsening difficulty breathing. °· You develop new or worsening abdominal pain. °· You develop new or worsening swelling in the scrotum (in men). °  °This information is not intended to replace advice given to you by your health care provider. Make sure you discuss any questions you have with your health care provider. °  °Document Released: 01/16/2005 Document Revised: 02/06/2014 Document Reviewed: 08/15/2013 °Elsevier Interactive Patient Education ©2016 [employer] ° °

## 2015-01-26 NOTE — H&P (Signed)
Family Medicine Teaching [facility] Admission History and Physical Service Pager: [phone]  Patient name: Mike Sosa Medical record number: [id number] Date of birth: 1975-03-28 Age: 39 y.o. Gender: male  Primary Care Provider: Tonye PearsonOLITTLE, ROBERT P, MD Consultants: GI Code Status: Full  Chief Complaint: Ascites   Assessment and Plan: Mike Skainsndre M Micek is a 39 y.o. male presenting with worsening ascites. PMH is significant for chronic back pain and tobacco abuse (11.5 pack years).  Ascites: Previous CT showed severe cirrhosis with massive ascites and marked splenomegaly. Given acute presentation, will repeat hepatitis labs and imaging. Also with concern for possible cancer given extensive smoking history.  - Ordered APTT, INR, reticulocytes, Gamma GT, hepatitis panel, Hgb A1c - Ordered magnesium, phosphorous and CMP to evaluate baseline electrolytes - Ordered UA to look for protein wasting - UDS ordered - Obtain CXR to rule-out potential metastatic source of liver disease.  - Repeat RUQ U/S - Lasix 20 mg IV BID started for symptomatic relief (home dose is PO 20 mg daily) - Hold home spironolactone and k-dur with IV diuresis - Obtain q12h BMP while undergoing more aggressive diuresis - Monitor on telemetry because of potential electrolyte changes and large fluid shifts with diuresis - Consult GI for recommendations and guidance on further work-up (need for liver biopsy?) - Consider therapeutic paracentesis for symptom relief  Tobacco abuse: - Nicotine patch ordered  Thrombocytopenia: Stable. 28 on admission. Was 31 11/22/2014 and 113 on 08/13/2007.  - Continue to monitor  Anemia: Hgb 11.0 on admission. Was 13.0 on 01/18/2015.  - Continue to monitor.  - Obtain type and screen --> A neg  Back pain: Chronic after a fall ~ 8 years ago.  - Continue home oxycodone 30 mg q4h prn.  - Continue home gabapentin 300 mg, 1-2 tablets nightly   FEN/GI: regular diet, NPO at midnight for  possible therapeutic paracentesis Prophylaxis: None  Disposition: Admitted with telemetry under attending Dr. Leveda AnnaHensel for work-up of ascites.   History of Present Illness:  Mike Skainsndre M Copher is a 39 y.o. male presenting with worsening ascites. He says it started in June 2016 and noticed it getting a lot worse around November. His abdomen now feels very tight. He was a direct admit from [facility] for possible therapeutic paracentesis. He states he has been worked up and found to be negative for hepatitis. He reports he recently had an abdominal ultrasound that showed cirrhosis.   He has tried supplements like milk thistle and decreasing his soda intake to improve symptoms without improvement. He says treatment with lasix has increased the amount he urinates but provided little improvement. He denies regular alcohol use, stating he last drank during the summer no more than 2 beers on occasion. He smokes a 1/2 pack a day since he was [age]. He denies associated shortness of breath but does state it is easier to breath when he is sitting somewhat upright.   Recent outpatient labs include TSH of 2.978, AST of 47, ALT 20, PSA of 0.16, hgb A1c 5.1, LDL of 20, cholesterol of 58, and HDL of 24. He has a colonoscopy scheduled for January due to family history of colon cancer.   Review Of Systems: Per HPI with the following additions:  Otherwise the remainder of the systems were negative.  Patient Active Problem List   Diagnosis Date Noted  . Cirrhosis (HCC) 01/26/2015  . HTN (hypertension) 11/22/2014  . Edema 11/22/2014  . Umbilical hernia without obstruction and without gangrene 05/26/2014  . Family hx  of colon cancer 03/31/2014  . Family hx of prostate cancer 03/31/2014  . Smoker 02/09/2014  . BMI 31.0-31.9,adult 02/09/2014  . Chronic radicular lumbar pain 02/09/2014  . Encounter for chronic pain management 02/09/2014  . Elevated BP 02/09/2014    Past Medical History: Past Medical History  Diagnosis Date   . Depression   . Anxiety     Past Surgical History: No past surgical history on file.  Social History: Social History  Substance Use Topics  . Smoking status: Current Every Day Smoker -- 0.50 packs/day for 23 years    Types: Cigarettes  . Smokeless tobacco: Never Used  . Alcohol Use: No   Additional social history: Engaged and has 2 sons. Works in home improvement.  Please also refer to relevant sections of EMR.  Family History: Family History  Problem Relation Age of Onset  . Hyperlipidemia Father    FHx of thyroid issues (mother, sister), colon CA   Allergies and Medications: No Known Allergies No current facility-administered medications on file prior to encounter.   Current Outpatient Prescriptions on File Prior to Encounter  Medication Sig Dispense Refill  . furosemide (LASIX) 20 MG tablet Take 1 tablet (20 mg total) by mouth daily. 30 tablet 0  . gabapentin (NEURONTIN) 300 MG capsule Take 1-2 capsules (300-600 mg total) by mouth at bedtime. 60 capsule 5  . oxycodone (ROXICODONE) 30 MG immediate release tablet Take 1 tablet (30 mg total) by mouth every 4 (four) hours as needed for pain. 180 tablet 0  . potassium chloride SA (K-DUR,KLOR-CON) 20 MEQ tablet Take 1 tablet (20 mEq total) by mouth daily. 30 tablet 3  . spironolactone (ALDACTONE) 100 MG tablet Take 1 tablet (100 mg total) by mouth daily. 30 tablet 2    Objective: BP 138/81 mmHg  Pulse 92  Temp(Src) 98.5 F (36.9 C) (Oral)  Resp 21  Ht  (1.854 m)  Wt 274 lb (124.286 kg)  BMI 36.16 kg/m2  SpO2 99% Exam: General: Tired appearing male with extremely distended abdomen, resting in bed Eyes: Mild scleral icterus, PERRL, EOMI ENTM: Poor dentition, mucous membranes slightly tacky Neck: Suppler, no cervical lymphadenopathy Cardiovascular: RRR, S1, S2, no m/r/g Respiratory: Expiratory wheezes throughout, able to speak in complete sentences with ease Abdomen: Extremely distended abdomen with umbilical  hernia, caput medusa and striae, no TTP MSK: FROM, thin arms, 2+ pitting edema to knee Skin: No rashes or lesions Neuro: AOx3, no focal deficits Psych: Normal mood and affect, asks appropriate questions  Labs and Imaging: CBC BMET  No results for input(s): WBC, HGB, HCT, PLT in the last 168 hours. No results for input(s): NA, K, CL, CO2, BUN, CREATININE, GLUCOSE, CALCIUM in the last 168 hours.    Casey Burkitt, MD 01/26/2015, 4:32 PM PGY-1, [facility] Family Medicine FPTS Intern pager: [phone], text pages welcome  I have read and agree with the amended note as above Wenda Low, MD, PGY-3 11:59 PM

## 2015-01-26 NOTE — Progress Notes (Signed)
Darryl Lentndre M Lormand [id number] Admission Data: 01/26/2015 2:50 PM Attending Provider: Moses MannersWilliam A Hensel, MD  UYQ:[id number]:DOOLITTLE, Harrel LemonOBERT P, MD Consults/ Treatment Team:    Mike Sosa is a 39 y.o. male patient admitted from Urgent Care awake, alert  & orientated  X 3,  No Order, VSS - There were no vitals taken for this visit., , no c/o shortness of breath, no c/o chest pain, no distress noted.   Allergies:  No Known Allergies   Past Medical History  Diagnosis Date  . Depression   . Anxiety      Pt orientation to unit, room and routine. Information packet given to patient/family and safety video watched.  Admission INP armband ID verified with patient/family, and in place. SR up x 2, fall risk assessment complete with Patient and family verbalizing understanding of risks associated with falls. Pt verbalizes an understanding of how to use the call bell and to call for help before getting out of bed.  Skin, clean-dry- intact without evidence of bruising, or skin tears.   No evidence of skin break down noted on exam.     Will cont to monitor and assist as needed.  Kern ReapBrumagin, Tyriana Helmkamp L, RN 01/26/2015 2:50 PM

## 2015-01-26 NOTE — Progress Notes (Signed)
Subjective:  Patient ID: Mike Sosa, male    DOB: 20-Feb-1975  Age: 39 y.o. MRN: [id number]  CC: Fluid in legs on belly   HPI Mike Sosa presents  patient was seen by Dr. Merla Richesoolittle last week who advised the patient be hospitalized for paracentesis and evaluation of his cirrhosis and consultation with gastroenterologist. Patient had work and now is willing to be admitted. He has continued discomfort with peripheral edema and ascites. He has no shortness of breath nausea vomiting fever chills or other new acute complaints.  History Mike Sosa has a past medical history of Depression and Anxiety.   He has no past surgical history on file.   His  family history includes Hyperlipidemia in his father.  He   reports that he has been smoking Cigarettes.  He has a 11.5 pack-year smoking history. He has never used smokeless tobacco. He reports that he does not drink alcohol or use illicit drugs.  Outpatient Prescriptions Prior to Visit  Medication Sig Dispense Refill  . furosemide (LASIX) 20 MG tablet Take 1 tablet (20 mg total) by mouth daily. 30 tablet 0  . gabapentin (NEURONTIN) 300 MG capsule Take 1-2 capsules (300-600 mg total) by mouth at bedtime. 60 capsule 5  . oxycodone (ROXICODONE) 30 MG immediate release tablet Take 1 tablet (30 mg total) by mouth every 4 (four) hours as needed for pain. 180 tablet 0  . potassium chloride SA (K-DUR,KLOR-CON) 20 MEQ tablet Take 1 tablet (20 mEq total) by mouth daily. 30 tablet 3  . spironolactone (ALDACTONE) 100 MG tablet Take 1 tablet (100 mg total) by mouth daily. 30 tablet 2   No facility-administered medications prior to visit.    Social History   Social History  . Marital Status: Single    Spouse Name: N/A  . Number of Children: N/A  . Years of Education: N/A   Social History Main Topics  . Smoking status: Current Every Day Smoker -- 0.50 packs/day for 23 years    Types: Cigarettes  . Smokeless tobacco: Never Used  . Alcohol Use: No    . Drug Use: No  . Sexual Activity: Not Asked   Other Topics Concern  . None   Social History Narrative     Review of Systems  Constitutional: Negative for fever, chills and appetite change.  HENT: Negative for congestion, ear pain, postnasal drip, sinus pressure and sore throat.   Eyes: Negative for pain and redness.  Respiratory: Positive for shortness of breath. Negative for cough and wheezing.   Cardiovascular: Negative for leg swelling.  Gastrointestinal: Positive for abdominal distention. Negative for nausea, vomiting, abdominal pain, diarrhea, constipation and blood in stool.  Endocrine: Negative for polyuria.  Genitourinary: Negative for dysuria, urgency, frequency and flank pain.  Musculoskeletal: Negative for gait problem.  Skin: Negative for rash.  Neurological: Negative for weakness and headaches.  Psychiatric/Behavioral: Negative for confusion and decreased concentration. The patient is not nervous/anxious.     Objective:  BP 140/80 mmHg  Pulse 94  Temp(Src) 98 F (36.7 C) (Oral)  Resp 20  Ht 6' (1.829 m)  Wt 274 lb (124.286 kg)  BMI 37.15 kg/m2  SpO2 97%  Physical Exam  Constitutional: He is oriented to person, place, and time. He appears well-developed and well-nourished.  HENT:  Head: Normocephalic and atraumatic.  Eyes: Conjunctivae are normal. Pupils are equal, round, and reactive to light.  Pulmonary/Chest: Effort normal.  Abdominal: He exhibits distension.  Musculoskeletal: He exhibits no edema.  Neurological:  He is alert and oriented to person, place, and time.  Skin: Skin is dry.  Psychiatric: He has a normal mood and affect. His behavior is normal. Thought content normal.      Assessment & Plan:   Mike Sosa was seen today for fluid in legs on belly.  Diagnoses and all orders for this visit:  Ascites  Abdominal distension  I am having Mike Sosa maintain his gabapentin, oxycodone, furosemide, spironolactone, and potassium chloride SA.  No  orders of the defined types were placed in this encounter.    Appropriate red flag conditions were discussed with the patient as well as actions that should be taken.  Patient expressed his understanding.  Follow-up: Return if symptoms worsen or fail to improve.  Carmelina Dane, MD

## 2015-01-27 ENCOUNTER — Inpatient Hospital Stay (HOSPITAL_COMMUNITY): Payer: BLUE CROSS/BLUE SHIELD

## 2015-01-27 ENCOUNTER — Encounter (HOSPITAL_COMMUNITY): Payer: Self-pay

## 2015-01-27 DIAGNOSIS — K7460 Unspecified cirrhosis of liver: Principal | ICD-10-CM

## 2015-01-27 DIAGNOSIS — R188 Other ascites: Secondary | ICD-10-CM | POA: Insufficient documentation

## 2015-01-27 DIAGNOSIS — R894 Abnormal immunological findings in specimens from other organs, systems and tissues: Secondary | ICD-10-CM

## 2015-01-27 DIAGNOSIS — Z720 Tobacco use: Secondary | ICD-10-CM

## 2015-01-27 DIAGNOSIS — R768 Other specified abnormal immunological findings in serum: Secondary | ICD-10-CM | POA: Insufficient documentation

## 2015-01-27 LAB — ALBUMIN, FLUID (OTHER): Albumin, Fluid: <1 g/dL

## 2015-01-27 LAB — COMPREHENSIVE METABOLIC PANEL
ALT: 20 U/L (ref 17–63)
AST: 49 U/L — ABNORMAL HIGH (ref 15–41)
Albumin: 2.9 g/dL — ABNORMAL LOW (ref 3.5–5.0)
Alkaline Phosphatase: 71 U/L (ref 38–126)
Anion gap: 9 (ref 5–15)
BUN: 14 mg/dL (ref 6–20)
CHLORIDE: 96 mmol/L — AB (ref 101–111)
CO2: 30 mmol/L (ref 22–32)
CREATININE: 1.02 mg/dL (ref 0.61–1.24)
Calcium: 8.5 mg/dL — ABNORMAL LOW (ref 8.9–10.3)
Glucose, Bld: 86 mg/dL (ref 65–99)
POTASSIUM: 3.5 mmol/L (ref 3.5–5.1)
SODIUM: 135 mmol/L (ref 135–145)
Total Bilirubin: 3.4 mg/dL — ABNORMAL HIGH (ref 0.3–1.2)
Total Protein: 7 g/dL (ref 6.5–8.1)

## 2015-01-27 LAB — BODY FLUID CELL COUNT WITH DIFFERENTIAL
EOS FL: 0 %
LYMPHS FL: 13 %
Monocyte-Macrophage-Serous Fluid: 86 % (ref 50–90)
NEUTROPHIL FLUID: 1 % (ref 0–25)
OTHER CELLS FL: 0 %
WBC FLUID: 82 uL (ref 0–1000)

## 2015-01-27 LAB — GRAM STAIN

## 2015-01-27 LAB — CBC
HCT: 33.8 % — ABNORMAL LOW (ref 39.0–52.0)
Hemoglobin: 11.9 g/dL — ABNORMAL LOW (ref 13.0–17.0)
MCH: 29.8 pg (ref 26.0–34.0)
MCHC: 35.2 g/dL (ref 30.0–36.0)
MCV: 84.5 fL (ref 78.0–100.0)
PLATELETS: 30 10*3/uL — AB (ref 150–400)
RBC: 4 MIL/uL — AB (ref 4.22–5.81)
RDW: 17.2 % — AB (ref 11.5–15.5)
WBC: 4.9 10*3/uL (ref 4.0–10.5)

## 2015-01-27 LAB — BASIC METABOLIC PANEL
ANION GAP: 10 (ref 5–15)
BUN: 16 mg/dL (ref 6–20)
CHLORIDE: 96 mmol/L — AB (ref 101–111)
CO2: 29 mmol/L (ref 22–32)
Calcium: 8.6 mg/dL — ABNORMAL LOW (ref 8.9–10.3)
Creatinine, Ser: 1.05 mg/dL (ref 0.61–1.24)
GFR calc non Af Amer: >60 mL/min (ref >60)
Glucose, Bld: 107 mg/dL — ABNORMAL HIGH (ref 65–99)
POTASSIUM: 3.6 mmol/L (ref 3.5–5.1)
SODIUM: 135 mmol/L (ref 135–145)

## 2015-01-27 LAB — HEPATITIS PANEL, ACUTE
HEP A IGM: NEGATIVE
HEP B S AG: NEGATIVE
Hep B C IgM: NEGATIVE

## 2015-01-27 LAB — IRON AND TIBC
IRON: 131 ug/dL (ref 45–182)
SATURATION RATIOS: 31 % (ref 17.9–39.5)
TIBC: 417 ug/dL (ref 250–450)
UIBC: 286 ug/dL

## 2015-01-27 LAB — HEMOGLOBIN A1C
HEMOGLOBIN A1C: 5 % (ref 4.8–5.6)
Mean Plasma Glucose: 97 mg/dL

## 2015-01-27 LAB — FERRITIN: FERRITIN: 60 ng/mL (ref 24–336)

## 2015-01-27 MED ORDER — LIDOCAINE HCL (PF) 1 % IJ SOLN
INTRAMUSCULAR | Status: AC
  Filled 2015-01-27: qty 10

## 2015-01-27 MED ORDER — SPIRONOLACTONE 25 MG PO TABS
100.0000 mg | ORAL_TABLET | Freq: Every day | ORAL | Status: DC
  Administered 2015-01-27 – 2015-01-28 (×2): 100 mg via ORAL
  Filled 2015-01-27 (×2): qty 4

## 2015-01-27 MED ORDER — ALBUMIN HUMAN 25 % IV SOLN
100.0000 g | Freq: Once | INTRAVENOUS | Status: DC
  Filled 2015-01-27: qty 400

## 2015-01-27 NOTE — Procedures (Signed)
   US guided LLQ paracentesis  7 liters yellow fluid obtained (first time paracentesis) Sent for labs per MD  BP: 140/75 post procedure Tolerated well

## 2015-01-27 NOTE — Care Management Note (Signed)
Case Management Note  Patient Details  Name: Mike Sosa MRN: [id number] Date of Birth: 09-Mar-1975  Subjective/Objective:                  Date-01-27-15 Initial Assessment Spoke with patient at the bedside.  Introduced self as Sports coachcase manager and explained role in discharge planning and how to be reached.  [place] fiance and son.  Verified patient anticipates to go home with family at time of discharge.  Patient has no DME. Expressed potential need for no other DME.  Patient denied  needing help with their medication.  Patient drives to MD appointments.  Verified patient has PCP Merla Richesoolittle. Patient states they currently receive HH services through no one.    Plan: CM will continue to follow [facility].   Lawerance Sabalebbie Tyianna Menefee RN BSN CM [phone]   Action/Plan:   Expected Discharge Date:                  Expected Discharge Plan:  Home/Self Care  In-House Referral:     Discharge planning Services  CM Consult  Post Acute Care Choice:    Choice offered to:     DME Arranged:    DME Agency:     HH Arranged:    HH Agency:     Status of Service:  In process, will continue to follow  Medicare Important Message Given:    Date Medicare IM Given:    Medicare IM give by:    Date Additional Medicare IM Given:    Additional Medicare Important Message give by:     If discussed at Long Length of Stay Meetings, dates discussed:    Additional Comments:  Lawerance SabalDebbie Zoiee Wimmer, RN 01/27/2015, 3:28 PM

## 2015-01-27 NOTE — Progress Notes (Signed)
Utilization review complete. Jeriah Skufca RN CCM Case Mgmt phone [phone] 

## 2015-01-27 NOTE — Consult Note (Signed)
[facility] Consultation Note  Referring Provider: Dr. Doralee AlbinoWilliam Hensel Primary Care Physician:  Tonye PearsonOLITTLE, ROBERT P, MD  Reason for Consultation:  Ascites, cirrhosis  HPI: Mike Sosa is a 39 y.o. male whom we've been asked to see for the above reason.  Patient was in static state of GI health until about 6 months ago.  At that time, he began having swelling in lower extremities and weight gain.  He thought this was due to deconditioning, and changed his diet and increased his activity level.  Despite these measures, he continued to gain weight, especially in his abdomen.  Has abdominal "tightness" but no abdominal pain.  No hematemesis or melena or hematochezia.  No confusion spells.  Imaging studies consistent with cirrhosis, portal hypertension and ascites.  There is no family history of liver disease.  Patient has not previously been aware of personal history of liver problems.  He drinks "2 beers a week."  HCV antibody positive, but denies IVDA or tattoos.  He describes blood exposure to coworker a few years ago, who might have had Hepatitis C.   Past Medical History  Diagnosis Date  . Depression   . Anxiety     History reviewed. No pertinent past surgical history.  Prior to Admission medications   Medication Sig Start Date End Date Taking? Authorizing Provider  aspirin-acetaminophen-caffeine (EXCEDRIN MIGRAINE) [phone] MG tablet Take 1 tablet by mouth every 8 (eight) hours as needed for headache or migraine.   Yes Historical Provider, MD  calcium carbonate (TUMS - DOSED IN MG ELEMENTAL CALCIUM) 500 MG chewable tablet Chew 1 tablet by mouth daily as needed for indigestion or heartburn.   Yes Historical Provider, MD  furosemide (LASIX) 20 MG tablet Take 1 tablet (20 mg total) by mouth daily. 01/18/15  Yes Tonye Pearsonobert P Doolittle, MD  gabapentin (NEURONTIN) 300 MG capsule Take 1-2 capsules (300-600 mg total) by mouth at bedtime. 01/18/15  Yes Tonye Pearsonobert P Doolittle, MD  MILK THISTLE PO  Take 1 tablet by mouth daily.   Yes Historical Provider, MD  Multiple Vitamin (MULTIVITAMIN WITH MINERALS) TABS tablet Take 1 tablet by mouth daily.   Yes Historical Provider, MD  oxycodone (ROXICODONE) 30 MG immediate release tablet Take 1 tablet (30 mg total) by mouth every 4 (four) hours as needed for pain. 01/18/15  Yes Tonye Pearsonobert P Doolittle, MD  potassium chloride SA (K-DUR,KLOR-CON) 20 MEQ tablet Take 1 tablet (20 mEq total) by mouth daily. 01/19/15  Yes Tonye Pearsonobert P Doolittle, MD  spironolactone (ALDACTONE) 100 MG tablet Take 1 tablet (100 mg total) by mouth daily. 01/19/15  Yes Tonye Pearsonobert P Doolittle, MD  VITAMIN K PO Take 1 tablet by mouth daily.   Yes Historical Provider, MD    Current Facility-Administered Medications  Medication Dose Route Frequency Provider Last Rate Last Dose  . 0.9 %  sodium chloride infusion  250 mL Intravenous PRN Jamal CollinJames R Joyner, MD      . furosemide (LASIX) injection 20 mg  20 mg Intravenous BID Jamal CollinJames R Joyner, MD   20 mg at 01/27/15 0813  . gabapentin (NEURONTIN) capsule 300-600 mg  300-600 mg Oral QHS Jamal CollinJames R Joyner, MD   300 mg at 01/26/15 2218  . nicotine (NICODERM CQ - dosed in mg/24 hours) patch 14 mg  14 mg Transdermal Daily Jamal CollinJames R Joyner, MD   14 mg at 01/27/15 0904  . oxyCODONE (Oxy IR/ROXICODONE) immediate release tablet 30 mg  30 mg Oral Q4H PRN Jamal CollinJames R Joyner, MD   30 mg  at 01/27/15 1049  . sodium chloride 0.9 % injection 3 mL  3 mL Intravenous Q12H Jamal Collin, MD   3 mL at 01/27/15 0232  . sodium chloride 0.9 % injection 3 mL  3 mL Intravenous Q12H Jamal Collin, MD   3 mL at 01/26/15 2200  . sodium chloride 0.9 % injection 3 mL  3 mL Intravenous PRN Jamal Collin, MD      . spironolactone (ALDACTONE) tablet 100 mg  100 mg Oral Daily Smitty Cords, DO   100 mg at 01/27/15 1020    Allergies as of 01/26/2015  . (No Known Allergies)    Family History  Problem Relation Age of Onset  . Hyperlipidemia Father     Social History   Social  History  . Marital Status: Single    Spouse Name: N/A  . Number of Children: N/A  . Years of Education: N/A   Occupational History  . Not on file.   Social History Main Topics  . Smoking status: Current Every Day Smoker -- 0.50 packs/day for 23 years    Types: Cigarettes  . Smokeless tobacco: Never Used  . Alcohol Use: 0.0 oz/week    0 Standard drinks or equivalent per week  . Drug Use: No  . Sexual Activity: Not on file   Other Topics Concern  . Not on file   Social History Narrative    Review of Systems: Positive = bold Gen: Denies any fever, chills, rigors, night sweats, anorexia, fatigue, weakness, malaise, involuntary weight gain, and sleep disorder CV: Denies chest pain, angina, palpitations, syncope, orthopnea, PND, peripheral edema, and claudication. Resp: Denies dyspnea, cough, sputum, wheezing, coughing up blood. GI: Described in detail in HPI.    GU : Denies urinary burning, blood in urine, urinary frequency, urinary hesitancy, nocturnal urination, and urinary incontinence. MS: Denies joint pain or swelling.  Denies muscle weakness, cramps, atrophy.  Derm: Denies rash, itching, oral ulcerations, hives, unhealing ulcers.  Psych: Denies depression, anxiety, memory loss, suicidal ideation, hallucinations,  and confusion. Heme: Denies bruising, bleeding, and enlarged lymph nodes. Neuro:  Denies any headaches, dizziness, paresthesias. Endo:  Denies any problems with DM, thyroid, adrenal function.  Physical Exam: Vital signs in last 24 hours: Temp:  [98.3 F (36.8 C)-98.7 F (37.1 C)] 98.3 F (36.8 C) (12/28 0602) Pulse Rate:  [83-92] 83 (12/28 0602) Resp:  [18-21] 18 (12/28 0602) BP: (138-141)/(81-90) 141/90 mmHg (12/28 0602) SpO2:  [99 %-100 %] 100 % (12/28 0602) Weight:  [124.286 kg (274 lb)-127.2 kg (280 lb 6.8 oz)] 127.2 kg (280 lb 6.8 oz) (12/28 0602) Last BM Date: 01/25/15 General:   Alert,  Obese, Well-developed, well-nourished, pleasant and cooperative  in NAD Head:  Normocephalic and atraumatic. Eyes:  Sclera clear, no icterus.   Conjunctiva pink. Ears:  Normal auditory acuity. Nose:  No deformity, discharge,  or lesions. Mouth:  No deformity or lesions.  Oropharynx pink & moist. Neck:  Supple; no masses or thyromegaly. Lungs:  Diminished breath sounds at both bases, otherwise clear throughout to auscultation.   No wheezes, crackles, or rhonchi. No acute distress. Heart:  Regular rate and rhythm; no murmurs, clicks, rubs,  or gallops. Abdomen:  Soft, moderately severe ascites with striae and caput medusa; 3cm diameter reducible umbilical hernia; No masses, hepatosplenomegaly or hernias noted. Normal bowel sounds, without guarding, and without rebound.     Msk: supraclavicular wasting; symmetrical without gross deformities. Normal posture. Pulses:  Normal pulses noted. Extremities:  Without clubbing;  3+ edema bilateral lower extremities Neurologic:  Alert and  oriented x4;  grossly normal neurologically. Skin:  Scattered ecchymoses and telangiectasias, otherwise intact without significant lesions or rashes. Cervical Nodes:  No significant cervical adenopathy. Psych:  Alert and cooperative. Normal mood and affect.   Lab Results:  Recent Labs  01/26/15 1659 01/27/15 0553  WBC 4.1 4.9  HGB 11.0* 11.9*  HCT 31.5* 33.8*  PLT 28* 30*   BMET  Recent Labs  01/26/15 1659 01/27/15 0553  NA 134* 135  K 3.5 3.5  CL 97* 96*  CO2 29 30  GLUCOSE 114* 86  BUN 15 14  CREATININE 1.08 1.02  CALCIUM 8.4* 8.5*   LFT  Recent Labs  01/27/15 0553  PROT 7.0  ALBUMIN 2.9*  AST 49*  ALT 20  ALKPHOS 71  BILITOT 3.4*   PT/INR  Recent Labs  01/26/15 1659  LABPROT 19.8*  INR 1.68*    Studies/Results: Dg Chest 2 View  01/26/2015  CLINICAL DATA:  39 year old male with shortness of breath. EXAM: CHEST  2 VIEW COMPARISON:  None. FINDINGS: The cardiomediastinal silhouette is unremarkable. This is a low volume film. There is no  evidence of focal airspace disease, pulmonary edema, suspicious pulmonary nodule/mass, pleural effusion, or pneumothorax. No acute bony abnormalities are identified. IMPRESSION: No active cardiopulmonary disease. Electronically Signed   By: Harmon Pier M.D.   On: 01/26/2015 16:04   US Abdomen Limited Ruq  01/27/2015  CLINICAL DATA:  Cirrhosis, ascites EXAM: US ABDOMEN LIMITED - RIGHT UPPER QUADRANT COMPARISON:  01/19/2015 FINDINGS: Gallbladder: There is cholelithiasis. There are several gallstones the largest measuring 17 mm. Gallbladder wall thickness is normal at under 3 mm and there is no Murphy's sign. Common bile duct: Diameter: 5 mm Liver: Liver is diminutive with markedly nodular contours. No focal abnormalities detected. There is a large volume of ascites. IMPRESSION: Cholelithiasis.  Evidence of cirrhosis and large volume of ascites. Electronically Signed   By: Esperanza Heir M.D.   On: 01/27/2015 10:32   Impression:  1.  Suspected cirrhosis (based clinically, and on labs, and on imaging studies), decompensated with coagulopathy and ascites and hypoalbuminemia.  Likely cause is Hepatitis C.  Could have component of non-alcohol fatty liver.  Alternatively, HCV antibody could be false-positive, which we sometimes see in patients with autoimmune hepatitis. 2.  Hepatitis C antibody positive. 3.  Mild alcohol use.  At least at the amount he describes (2 beers per week), doubt this is sufficient to cause cirrhosis in someone so young. 4.  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  He does not have acute liver failure, but does appear to have alarmingly severe liver disease in someone of such a relatively young age.  Plan:  1.  Paracentesis (diagnostic, with fluid studies; and therapeutic). 2.  Agree with HCV viral load. 3.  Host of other labs:  Hep B, ANA, ASMA, AMA, immunoglobulins, iron profile, ferritin, ceruloplasmin, A1AT level, HIV. 4.  Low sodium diet. 5.  Be  gentle with diuretics, mindful of closely watching renal function and electrolytes. 6.  Patient will need expedited outpatient referral to liver transplant center, which I will set up. 7.  [facility] will follow.   LOS: 1 day   Leonce Bale M  01/27/2015, 11:59 AM  Pager [phone] If no answer or after 5 PM call [phone]

## 2015-01-27 NOTE — Progress Notes (Signed)
Family Medicine Teaching Service Daily Progress Note Intern Pager: [phone]  Patient name: Mike Sosa Medical record number: [id number] Date of birth: May 31, 1975 Age: 39 y.o. Gender: male  Primary Care Provider: Tonye Pearson, MD Consultants: Deboraha Sprang GI Code Status: Full  Pt Overview and Major Events to Date:  12/27 - admitted with ascites, prior diagnosis of severe cirrhosis on outside CT 12/28 - RUQ [place] (large volume ascites), Hep C ab (positive), IR Paracentesis (diagnostic, therapeutic)  Assessment and Plan: Mike Sosa is a 39 y.o. male presenting with worsening ascites. PMH is significant for chronic back pain and tobacco abuse (11.5 pack years).  Suspected Cirrhosis (likely Hep C), severe decompensated with associated coagulopathy and large volume ascites, without evidence of acute liver failure Last imaging on 12/20 with CT demonstrating severe cirrhosis with massive ascites. Outside labs at [facility] without results available. Labs re-drawn on admit, demonstrate positive Hep C ab (no known IVDA, but history of blood exposure in past), no significant alcohol history by report. Consistent with severe cirrhosis with associated coagulopathy, hypoalbuminemia. No evidence of malignancy or metastatic disease on imaging. - Consulted [facility], concern with Child's C cirrhosis and MELD score 17, proceeding with work-up, and they will expedite outpatient referral to liver transplant center - Labs: Hep C quant RNA (given positive Hep c ab screen), GI added other work-up with Hep B, and autoimmune labs ANA, immunoglobulins, iron profile - IR guided paracentesis today for diagnostic / therapeutic (fluid studies ordered) - Diuresis with Lasix  IV BID, added Spironolactone  daily today - BMET q 12 hr while on diuresis  Tobacco abuse: - Nicotine patch ordered  Chronic Thrombocytopenia, secondary to cirrhosis with coagulopathy - Stable On admit 28. Previously 113 in 2009. - Follow Plt  count on CBC, today 30, able to proceed to paracentesis  Anemia: Hgb 11.0 on admission. Was 13.0 on 01/18/2015.  - Continue to monitor.  - Obtain type and screen --> A neg  Back pain: Chronic after a fall ~ 8 years ago.  - Continue home oxycodone 30 mg q4h prn.  - Continue home gabapentin 300 mg, 1-2 tablets nightly   FEN/GI: regular diet Prophylaxis: SCDs, with cirrhotic coagulopathy  Disposition: Inpatient status, continue work-up for new diagnosis severe cirrhosis and large volume ascites, suspected Hep C, GI consulted, IR paracentesis today, further lab work-up, ultimately may need liver transplant, continue diuresis.  Subjective:  Today reports good urine output with lasix overnight, voiding well. Still edema in lower legs but some improved. Abdomen still edematous without change. Denies any abdominal pain, nausea, vomiting, fevers/chills. Awaiting [place] and paracentesis.  Objective: Temp:  [98 F (36.7 C)-98.7 F (37.1 C)] 98.3 F (36.8 C) (12/28 0602) Pulse Rate:  [83-94] 83 (12/28 0602) Resp:  [18-21] 18 (12/28 0602) BP: (138-141)/(80-90) 141/90 mmHg (12/28 0602) SpO2:  [97 %-100 %] 100 % (12/28 0602) Weight:  [274 lb (124.286 kg)-280 lb 6.8 oz (127.2 kg)] 280 lb 6.8 oz (127.2 kg) (12/28 0602) Physical Exam: General: chronically ill appearing, resting in bed, comfortable Cardiovascular: RRR, no murmurs Respiratory: CTAB with diminished breath sounds bilateral bases Abdomen: soft, non-tender, no rebound, large abdomen with severe ascites and caput medusa. Regular bowel sounds. Extremities: Bilateral lower extremity +3 pitting edema below knees Neuro - awake, alert, oriented  Laboratory:  Recent Labs Lab 01/26/15 1659 01/27/15 0553  WBC 4.1 4.9  HGB 11.0* 11.9*  HCT 31.5* 33.8*  PLT 28* PENDING    Recent Labs Lab 01/26/15 1659 01/27/15 0553  NA 134* 135  K 3.5 3.5  CL 97* 96*  CO2 29 30  BUN 15 14  CREATININE 1.08 1.02  CALCIUM 8.4* 8.5*  PROT 6.3*  7.0  BILITOT 2.4* 3.4*  ALKPHOS 94 71  ALT 20 20  AST 47* 49*  GLUCOSE 114* 86   UDS - positive only opiates  Imaging/Diagnostic Tests:  CXR 12/27 IMPRESSION: No active cardiopulmonary disease.  RUQ US 12/28 Large volume ascites. Cholelithiasis.  Prior to hospitalization CT Abd/Pelvis IMPRESSION: Changes of severe cirrhosis with massive ascites and marked splenomegaly.  Mike CordsAlexander J Karamalegos, DO 01/27/2015, 8:23 AM PGY-3, [facility] FPTS Intern pager: [phone], text pages welcome

## 2015-01-28 LAB — IGG, IGA, IGM
IGA: 244 mg/dL (ref 90–386)
IGG (IMMUNOGLOBIN G), SERUM: 2787 mg/dL — AB (ref 700–1600)
IgM, Serum: 84 mg/dL (ref 20–172)

## 2015-01-28 LAB — ANTINUCLEAR ANTIBODIES, IFA: ANA Ab, IFA: POSITIVE — AB

## 2015-01-28 LAB — FANA STAINING PATTERNS: Speckled Pattern: 1:80 {titer}

## 2015-01-28 LAB — BASIC METABOLIC PANEL
ANION GAP: 5 (ref 5–15)
BUN: 16 mg/dL (ref 6–20)
CO2: 30 mmol/L (ref 22–32)
Calcium: 8 mg/dL — ABNORMAL LOW (ref 8.9–10.3)
Chloride: 100 mmol/L — ABNORMAL LOW (ref 101–111)
Creatinine, Ser: 0.96 mg/dL (ref 0.61–1.24)
GFR calc Af Amer: >60 mL/min (ref >60)
GLUCOSE: 119 mg/dL — AB (ref 65–99)
POTASSIUM: 3.4 mmol/L — AB (ref 3.5–5.1)
Sodium: 135 mmol/L (ref 135–145)

## 2015-01-28 LAB — HEPATITIS B CORE ANTIBODY, TOTAL: HEP B C TOTAL AB: NEGATIVE

## 2015-01-28 LAB — CERULOPLASMIN: CERULOPLASMIN: 33.2 mg/dL — AB (ref 16.0–31.0)

## 2015-01-28 LAB — HEPATITIS B SURFACE ANTIBODY,QUALITATIVE: HEP B S AB: NONREACTIVE

## 2015-01-28 LAB — HEPATITIS A ANTIBODY, TOTAL: Hep A Total Ab: POSITIVE — AB

## 2015-01-28 LAB — ANTI-SMOOTH MUSCLE ANTIBODY, IGG: F-ACTIN AB IGG: 34 U — AB (ref 0–19)

## 2015-01-28 LAB — MITOCHONDRIAL ANTIBODIES: MITOCHONDRIAL M2 AB, IGG: 12.8 U (ref 0.0–20.0)

## 2015-01-28 MED ORDER — FUROSEMIDE 40 MG PO TABS: 40.0000 mg | ORAL_TABLET | Freq: Two times a day (BID) | ORAL | Status: DC

## 2015-01-28 MED ORDER — NICOTINE 14 MG/24HR TD PT24: 14.0000 mg | MEDICATED_PATCH | Freq: Every day | TRANSDERMAL | Status: DC

## 2015-01-28 MED ORDER — FUROSEMIDE 40 MG PO TABS
40.0000 mg | ORAL_TABLET | Freq: Two times a day (BID) | ORAL | Status: DC
  Administered 2015-01-28: 40 mg via ORAL
  Filled 2015-01-28: qty 1

## 2015-01-28 NOTE — Progress Notes (Signed)
Mike LentAndre M Sosa to be D/C'd to home per MD order.  Discussed with the patient and all questions fully answered.  VSS, Skin clean, dry and intact without evidence of skin break down, no evidence of skin tears noted. IV catheter discontinued intact. Site without signs and symptoms of complications. Dressing and pressure applied.  An After Visit Summary was printed and given to the patient. Patient received prescriptions.  D/c education completed with patient/family including follow up instructions, medication list, d/c activities limitations if indicated, with other d/c instructions as indicated by MD - patient able to verbalize understanding, all questions fully answered.   Patient instructed to return to ED, call 911, or call MD for any changes in condition.   Patient escorted via WC, and D/C home via private auto.  [name] 01/28/2015 1:51 PM

## 2015-01-28 NOTE — Care Management Note (Signed)
Case Management Note  Patient Details  Name: Hulen Skainsndre M Castelluccio MRN: [id number] Date of Birth: [birth year]  Subjective/Objective:                  Date-01-27-15 Initial Assessment Spoke with patient at the bedside.  Introduced self as Sports coachcase manager and explained role in discharge planning and how to be reached.  [place] fiance and son.  Verified patient anticipates to go home with family at time of discharge.  Patient has no DME. Expressed potential need for no other DME.  Patient denied needing help with their medication.  Patient drives to MD appointments.  Verified patient has PCP Merla Richesoolittle. Patient states they currently receive HH services through no one.   Action/Plan:  No CM needs identified, anticipate DC to home today, self care  Expected Discharge Date:                  Expected Discharge Plan:  Home/Self Care  In-House Referral:     Discharge planning Services  CM Consult  Post Acute Care Choice:    Choice offered to:     DME Arranged:    DME Agency:     HH Arranged:    HH Agency:     Status of Service:  Completed, signed off  Medicare Important Message Given:    Date Medicare IM Given:    Medicare IM give by:    Date Additional Medicare IM Given:    Additional Medicare Important Message give by:     If discussed at Long Length of Stay Meetings, dates discussed:    Additional Comments:  Lawerance SabalDebbie Deloris Mittag, RN 01/28/2015, 12:05 PM

## 2015-01-28 NOTE — Discharge Summary (Signed)
Family Medicine Teaching [facility] Discharge Summary  Patient name: Mike Sosa Medical record number: [id number] Date of birth: 11-08-1975 Age: 39 y.o. Gender: male Date of Admission: 01/26/2015  Date of Discharge: 01/28/15 Admitting Physician: Moses MannersWilliam A Hensel, MD  Primary Care Provider: Tonye PearsonOLITTLE, ROBERT P, MD Consultants: [facility]  Indication for Hospitalization: Worsening ascites, cirrhosis  Discharge Diagnoses/Problem List:  Cirrhosis (secondary to presumed Hep C), severe decompensated with associated coagulopathy, Portal HTN,  without evidence of acute liver failure Ascites, large volume - Improved s/p IR paracentesis 12/28 Chronic Thrombocytopenia, secondary to cirrhosis with coagulopathy - Stable Tobacco abuse Anemia of chronic disease Chronic Low Back Pain  Disposition: Home  Discharge Condition: Stable  Discharge Exam: General: chronically ill appearing, good spirits, resting and comfortable HEENT: Poor dentition Cardiovascular: RRR, no murmurs Respiratory: CTAB with improved breath sounds in bases Abdomen: Improved ascites s/p paracentesis, still ascites with softer distention, non-tender, no rebound, reducible umbilical hernia and caput medusa. Regular bowel sounds. Extremities: Bilateral lower extremity +3 pitting edema below knees Neuro - awake, alert, oriented  Brief Hospital Course:  Mike Sosa is a 39 year old male with PMH back pain and tobacco abuse and recent work[facility] demonstrating severe cirrhosis with massive ascites on CT scan (12/20) following office visit 12/19 with start of work-up for cirrhosis, concern at that time for possible admit vs paracentesis. Patient did not follow-up until 12/27, [facility] and [facility] further cirrhosis work-up, diuresis, and paracentesis.  During hospitalization, patient started on Lasix IV diuresis, then spironolactone was resumed with good  diuresis, proceeded to IR paracentesis 7L on 12/28 for therapeutic / diagnostic studies. 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 GI consulted, broadened lab work-up, concern with Child's C cirrhosis (survival 1-yr 45% and 2-yr 35%) with MELD score 17 (6% 3 month mortality). Demonstrated improvement, hemodynamically stable on diuretic regimen now Lasix 40mg  PO BID and Spironolactone 100mg  daily, close follow-up with PCP and GI arranged, patient stable for discharge.   Issues for Follow Up:  1. Cirrhosis - GI follow-up for future [facility] liver transplant referral. Follow-up autoimmune labs 2. Hepatitis C pending RNA quant on discharge - consider ID referral for evaluation if indicated for Hep C treatment 3. Ascites - Discharged on diuretics of Lasix 40 BID and Spiro 100 daily. May need repeat therapeutic paracentesis in future. 4. Portal HTN - consider start propanolol if BP able to tolerate for esophageal varices prophylaxis  Significant Procedures:  1. IR paracentesis 01/27/15  Significant Labs and Imaging:   Recent Labs Lab 01/26/15 1659 01/27/15 0553  WBC 4.1 4.9  HGB 11.0* 11.9*  HCT 31.5* 33.8*  PLT 28* 30*    Recent Labs Lab 01/26/15 1659 01/27/15 0553 01/27/15 1717 01/28/15 0448  NA 134* 135 135 135  K 3.5 3.5 3.6 3.4*  CL 97* 96* 96* 100*  CO2 29 30 29 30   GLUCOSE 114* 86 107* 119*  BUN 15 14 16 16   CREATININE 1.08 1.02 1.05 0.96  CALCIUM 8.4* 8.5* 8.6* 8.0*  MG 1.6*  --   --   --   PHOS 3.2  --   --   --   ALKPHOS 94 71  --   --   AST 47* 49*  --   --  ALT 20 20  --   --   ALBUMIN 2.8* 2.9*  --   --    GGT 26 A1c 5.0  Paracentesis Fluid Studies (12/28) - Gram stain (no organisms), fluid without concern for infection or malignancy. < 1 albumin, wbc 82, lymphs 13, clear appearance, neutrophils 1, culture  NGTD x 24 hours.  Hep C ab (screen) POSITIVE >11 Hep C RNA quant pending Hep A ab - positive Hep B core ab negative, surface ab non-reactive ANA - pending Ceruloplasmin 33.2 (borderline elevated) Iron Studies - Iron 131, TIBC 417, Tsat 31% Ferritin 60 Anti-mitochondrial ab - pending Anti-smooth muscle ab - pending  CXR 12/27 IMPRESSION: No active cardiopulmonary disease.  RUQ [place] 12/28 Large volume ascites. Cholelithiasis.  Prior to hospitalization CT Abd/Pelvis 01/19/15 IMPRESSION: Changes of severe cirrhosis with massive ascites and marked splenomegaly.  Results/Tests Pending at Time of Discharge: Hep C RNA quant - pending ANA - pending Anti-mitochondrial ab - pending Anti-smooth muscle ab - pending Paracentesis body fluid culture - NGTD x 24 hours  Discharge Medications:    Medication List    TAKE these medications        aspirin-acetaminophen-caffeine 250-250-65 MG tablet  Commonly known as:  EXCEDRIN MIGRAINE  Take 1 tablet by mouth every 8 (eight) hours as needed for headache or migraine.     calcium carbonate 500 MG chewable tablet  Commonly known as:  TUMS - dosed in mg elemental calcium  Chew 1 tablet by mouth daily as needed for indigestion or heartburn.     furosemide 40 MG tablet  Commonly known as:  LASIX  Take 1 tablet (40 mg total) by mouth 2 (two) times daily.     gabapentin 300 MG capsule  Commonly known as:  NEURONTIN  Take 1-2 capsules (300-600 mg total) by mouth at bedtime.     MILK THISTLE PO  Take 1 tablet by mouth daily.     multivitamin with minerals Tabs tablet  Take 1 tablet by mouth daily.     nicotine 14 mg/24hr patch  Commonly known as:  NICODERM CQ - dosed in mg/24 hours  Place 1 patch (14 mg total) onto the skin daily.     oxycodone 30 MG immediate release tablet  Commonly known as:  ROXICODONE  Take 1 tablet (30 mg total) by mouth every 4 (four) hours as needed for pain.     potassium chloride SA 20 MEQ tablet   Commonly known as:  K-DUR,KLOR-CON  Take 1 tablet (20 mEq total) by mouth daily.     spironolactone 100 MG tablet  Commonly known as:  ALDACTONE  Take 1 tablet (100 mg total) by mouth daily.     VITAMIN K PO  Take 1 tablet by mouth daily.        Discharge Instructions: Please refer to Patient Instructions section of EMR for full details.  Patient was counseled important signs and symptoms that should prompt return to medical care, changes in medications, dietary instructions, activity restrictions, and follow up appointments.   Follow-Up Appointments: Follow-up Information    Follow up with DOOLITTLE, Harrel Lemon, MD. Schedule an appointment as soon as possible for a visit in 1 week.   Specialties:  Internal Medicine, Adolescent Medicine   Why:  for hospital follow-up   Contact information:   [address] [place] [address] [phone]       Follow up with [facility]. Call in 1 week.   Why:  hospital follow-up to establish with  Liver specialist. They will refer you to [facility] doctors for discussion of transplant   Contact information:   [address] [place] [address] [phone]       Smitty Cords, DO 01/28/2015, 11:28 AM PGY-3, [facility]

## 2015-01-28 NOTE — Progress Notes (Signed)
Subjective: Abdomen less tight after paracentesis. Wants to go home.  Objective: Vital signs in last 24 hours: Temp:  [99.1 F (37.3 C)-99.2 F (37.3 C)] 99.1 F (37.3 C) (12/29 0513) Pulse Rate:  [88-95] 88 (12/29 0513) Resp:  [18-20] 20 (12/29 0513) BP: (129-150)/(69-87) 129/73 mmHg (12/29 0513) SpO2:  [97 %-99 %] 97 % (12/29 0513) Weight:  [129 kg (284 lb 6.3 oz)] 129 kg (284 lb 6.3 oz) (12/29 0500) Weight change: 4.714 kg (10 lb 6.3 oz) Last BM Date: 01/27/15  PE: GEN:  Overweight, older-appearing than stated age HEENT:  Poor dentition ABD:  Distended (but less after paracentesis); reducible umbilical hernia.  Lab Results: CBC    Component Value Date/Time   WBC 4.9 01/27/2015 0553   WBC 4.3* 01/18/2015 1851   RBC 4.00* 01/27/2015 0553   RBC 3.71* 01/26/2015 1659   RBC 4.47* 01/18/2015 1851   HGB 11.9* 01/27/2015 0553   HGB 13.0* 01/18/2015 1851   HCT 33.8* 01/27/2015 0553   HCT 37.5* 01/18/2015 1851   PLT 30* 01/27/2015 0553   MCV 84.5 01/27/2015 0553   MCV 84.0 01/18/2015 1851   MCH 29.8 01/27/2015 0553   MCH 29.1 01/18/2015 1851   MCHC 35.2 01/27/2015 0553   MCHC 34.6 01/18/2015 1851   RDW 17.2* 01/27/2015 0553   LYMPHSABS 0.7 01/26/2015 1659   MONOABS 0.5 01/26/2015 1659   EOSABS 0.1 01/26/2015 1659   BASOSABS 0.0 01/26/2015 1659   CMP     Component Value Date/Time   NA 135 01/28/2015 0448   K 3.4* 01/28/2015 0448   CL 100* 01/28/2015 0448   CO2 30 01/28/2015 0448   GLUCOSE 119* 01/28/2015 0448   BUN 16 01/28/2015 0448   CREATININE 0.96 01/28/2015 0448   CREATININE 0.79 01/18/2015 1850   CALCIUM 8.0* 01/28/2015 0448   PROT 7.0 01/27/2015 0553   ALBUMIN 2.9* 01/27/2015 0553   AST 49* 01/27/2015 0553   ALT 20 01/27/2015 0553   ALKPHOS 71 01/27/2015 0553   BILITOT 3.4* 01/27/2015 0553   GFRNONAA >60 01/28/2015 0448   GFRAA >60 01/28/2015 0448   HCV viral load pending IgG elevated Iron profile ok Slight elevation ceruloplasmin likely acute  phase reactant. Hep A total positive (prior exposure). Hep B s Ab negative; other labs and antibodies pending  Studies/Results: Paracentesis, PMN < 250/mm3, SAAG > 1.1 consistent with portal hypertension/cirrhosis  Assessment:  1.  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 2.  Ascites. 3.  HCV antibody positive.  Plan:  1.  Low sodium diet (< 2 grams daily), now and indefinitely upon discharge. 2.  Home diuretic regimen furosemide 40 mg po bid and spironolactone 100 mg po qd. 3.  Needs repeat BMP next week.  Awaiting HCV viral load. 4.  Awaiting many other labs; elevated IgG could be spurious but could herald autoimmune liver disease. 5.  Follow-up with me in [facility] office within 1-2 weeks. 6.  Patient will ultimately need elective endoscopy for gastroesophageal variceal screening. 7.  I will arrange outpatient evaluation at liver transplant center. 8.  Strongly counseled patient to stop smoking and desist completely from alcohol use (even though I don't think alcohol use is the ultimate cause of his liver disease). 9.  OK to discharge home today from GI perspective; will sign-off; thank you for the consultation; please call with any questions.    Freddy JakschOUTLAW,Collen Vincent M 01/28/2015, 9:16 AM   Pager [phone] If no answer or after 5 PM call [phone]

## 2015-01-28 NOTE — Discharge Instructions (Signed)
You were diagnosed with Severe Cirrhosis of liver. This was most likely caused by Hepatitis C infection, however we are still waiting on blood test results to confirm this, your primary doctor or Gi doctors will be able to provide these results next week. You were treated with higher dose fluid medicines to remove fluid and also had a Paracentesis procedure to manually remove 7 Liters of fluid "Ascites" from your abdomen. This fluid may come back, but the Lasix and Spironolactone will help prevent. Take Lasix 40mg  twice daily (new prescription) and resume Spironolactone 100mg  daily. Avoid excessive amounts of fluid intake. Strongly advised to follow low sodium diet (heart healthy), < 2000mg  daily intake of sodium  Recommend follow-up with PCP Dr Merla Richesoolittle sometime next week for hospital follow-up. They recommended repeat blood work in 1 week. Call (or you may be notified) [facility] to schedule follow-up within 1-2 weeks with liver specialist. They will talk about other lab test results that we are still waiting on. You may need an esophageal scope / endoscopy procedure in future. They will [facility]. Very important to quit smoking and quit all alcohol to improve success with liver transplant.  If gaining fluid back, please call your GI doctors and they may be able to arrange another Paracentesis without requiring to be admitted to hospital.

## 2015-01-29 LAB — HEPATITIS C GENOTYPE: HEPATITIS C GENOTYPE: 3

## 2015-01-29 LAB — HCV RNA QUANT RFLX ULTRA OR GENOTYP
HCV RNA Qnt(log copy/mL): 6.196 log10 IU/mL
HEPATITIS C QUANTITATION: 1570000 [IU]/mL

## 2015-02-01 LAB — CULTURE, BODY FLUID W GRAM STAIN -BOTTLE: Culture: NO GROWTH

## 2015-02-12 ENCOUNTER — Ambulatory Visit (INDEPENDENT_AMBULATORY_CARE_PROVIDER_SITE_OTHER): Payer: BLUE CROSS/BLUE SHIELD | Admitting: Internal Medicine

## 2015-02-12 VITALS — BP 130/80 | HR 107 | Temp 98.4°F | Resp 20 | Wt 240.0 lb

## 2015-02-12 DIAGNOSIS — K7460 Unspecified cirrhosis of liver: Secondary | ICD-10-CM

## 2015-02-12 DIAGNOSIS — M545 Low back pain, unspecified: Secondary | ICD-10-CM

## 2015-02-12 DIAGNOSIS — G8929 Other chronic pain: Secondary | ICD-10-CM | POA: Diagnosis not present

## 2015-02-12 DIAGNOSIS — B182 Chronic viral hepatitis C: Secondary | ICD-10-CM

## 2015-02-12 DIAGNOSIS — Z720 Tobacco use: Secondary | ICD-10-CM | POA: Diagnosis not present

## 2015-02-12 DIAGNOSIS — R188 Other ascites: Secondary | ICD-10-CM

## 2015-02-12 MED ORDER — OXYCODONE HCL 30 MG PO TABS: 30.0000 mg | ORAL_TABLET | ORAL | Status: DC | PRN

## 2015-02-12 NOTE — Progress Notes (Signed)
Subjective:    Patient ID: Mike Sosa, male    DOB: [birth year], 40 y.o.   MRN: [id number] By signing my name below, I, Javier Docker, attest that this documentation has been prepared under the direction and in the presence of [name], MD. Electronically Signed: Javier Docker, ER Scribe. 02/12/2015. 6:56 PM.  Chief Complaint  Patient presents with  . Medication Refill    Neurontin, oxycodone   HPI HPI Comments: Mike Sosa is a 40 y.o. male who presents to [facility] reporting for follow up after a recent hospitalization for cirrhosis/massive ascites. He has stopped smoking. He has lost 30 lbs in the last month due to fluid being taken off and diet changes.  He has an appointment with Dr. Leone Payor for GI f/u on Monday.   He is here for pain med refill. See initial note here 02/09/14 as pain management with Dr Manson Passey ended due to insurance. Efforts at establishing another referral have been thwarted by a number of things as noted. (My evaluation suggested that he might have treatable spine issue--that he had not been thoroughly evaluated in my opinion. ? Surgery had been requested 6-8 years ago and he had refused that as an option-too anxious. 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 He requires pain medicine to continue working.) Took 6 mo for insurance to approve MRI(8/16) which showed minimal changes.  He also did not f/u for Appt for health maint OV to establish PCP(because working so much)--at med f/u 10/23 he had edema/unusual wt gain and labs showing pancytopenia and hypoproteinemia BUT he did not return phone calls or f/u emergently as requested (due to work) until 12/19 when further eval revealed liver failure and led to subsequent hospitalization for paracentesis and diagnosis of Hep C as likely etiology(not a drinker)  No Known Allergies   Review of Systems Stable for now    Objective:  BP 130/80 mmHg  Pulse 107  Temp(Src) 98.4 F (36.9 C) (Oral)  Resp 20   Wt 240 lb (108.863 kg)  SpO2 98%  Wt Readings from Last 3 Encounters:  02/12/15 240 lb (108.863 kg)  01/28/15 284 lb 6.3 oz (129 kg)  01/26/15 274 lb (124.286 kg)    Physical Exam  Constitutional: He is oriented to person, place, and time. He appears well-developed and well-nourished. No distress.  HENT:  Head: Normocephalic and atraumatic.  Eyes: EOM are normal. Pupils are equal, round, and reactive to light.  Neck: Normal range of motion. Neck supple.  Pulmonary/Chest: Effort normal. No respiratory distress.  Musculoskeletal: Normal range of motion.  Neurological: He is alert and oriented to person, place, and time. No cranial nerve deficit.  Skin: Skin is warm and dry. He is not diaphoretic.  Psychiatric: He has a normal mood and affect. His behavior is normal.  Nursing note and vitals reviewed.     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 oxycodone (ROXICODONE) 30 MG immediate release tablet  Sig: Take 1 tablet (30 mg total) by mouth every 4 (four) hours as needed for pain.    Dispense:  180 tablet    Refill:  0  . oxycodone (ROXICODONE) 30 MG immediate release tablet    Sig: Take 1 tablet (30 mg total) by mouth every 4 (four) hours as needed for pain. For 30d after signed    Dispense:  180 tablet    Refill:  0      I have completed the patient encounter in its entirety as documented by the scribe, with editing by me where necessary. Robert P.  Merla Richesoolittle, M.D.

## 2015-02-12 NOTE — Patient Instructions (Signed)
Wt Readings from Last 3 Encounters:  02/12/15 240 lb (108.863 kg)  01/28/15 284 lb 6.3 oz (129 kg)  01/26/15 274 lb (124.286 kg)

## 2015-02-13 DIAGNOSIS — K7460 Unspecified cirrhosis of liver: Secondary | ICD-10-CM | POA: Insufficient documentation

## 2015-02-13 DIAGNOSIS — R188 Other ascites: Secondary | ICD-10-CM

## 2015-02-13 DIAGNOSIS — B182 Chronic viral hepatitis C: Secondary | ICD-10-CM | POA: Insufficient documentation

## 2015-02-14 ENCOUNTER — Other Ambulatory Visit: Payer: Self-pay | Admitting: Internal Medicine

## 2015-02-16 NOTE — Telephone Encounter (Signed)
Patient was seen on 1-13, did not see Lasix mentioned can we refill?

## 2015-02-17 NOTE — Telephone Encounter (Signed)
Please clarify with patient the dose and instructions for this medication.  It looks like the 20 mg was Rx'd during a hospital admission. There is another Rx for 40 mg, BID.  He may have a 30-day supply of whichever he is supposed to be on, but that isn't clear to me.

## 2015-02-19 MED ORDER — FUROSEMIDE 40 MG PO TABS: 40.0000 mg | ORAL_TABLET | Freq: Two times a day (BID) | ORAL | Status: DC

## 2015-02-19 NOTE — Telephone Encounter (Signed)
The following is copied from pt's discharge instr's from hosp:   furosemide 40 MG tablet  Commonly known as: LASIX  Take 1 tablet (40 mg total) by mouth 2 (two) times daily.           I called pt to verify the correct current dose and he stated that he is taking the "bigger" one twice a day, so he is sure that it is the 40 mg. Sent in the RF.

## 2015-02-23 ENCOUNTER — Telehealth: Payer: Self-pay

## 2015-02-23 NOTE — Telephone Encounter (Signed)
Pt states that he would preferred to be referred to Dr. Dulce Sellar [phone] w/ [facility] physicians due to already having a hospital follow-up for 03/18/15. Pt would like to cancel with [facility] and be referred to [facility] Instead.   [place]  Address: [address] [place], [place], [place] [address]  Phone: [phone]  [phone] PT call back

## 2015-02-24 NOTE — Telephone Encounter (Signed)
Ok for patient to cancel his appointment with [facility], as I have sent the referral to [facility] for Dr Dulce Sellar.

## 2015-02-26 ENCOUNTER — Ambulatory Visit: Payer: BLUE CROSS/BLUE SHIELD | Admitting: Internal Medicine

## 2015-03-17 ENCOUNTER — Other Ambulatory Visit (HOSPITAL_COMMUNITY): Payer: Self-pay | Admitting: Gastroenterology

## 2015-03-17 DIAGNOSIS — R188 Other ascites: Secondary | ICD-10-CM

## 2015-03-19 ENCOUNTER — Ambulatory Visit (HOSPITAL_COMMUNITY)
Admission: RE | Admit: 2015-03-19 | Discharge: 2015-03-19 | Disposition: A | Discharge status: Home / Self Care | Payer: BLUE CROSS/BLUE SHIELD | Source: Ambulatory Visit | Attending: Gastroenterology | Admitting: Gastroenterology

## 2015-03-19 DIAGNOSIS — K7460 Unspecified cirrhosis of liver: Secondary | ICD-10-CM | POA: Diagnosis not present

## 2015-03-19 DIAGNOSIS — B1920 Unspecified viral hepatitis C without hepatic coma: Secondary | ICD-10-CM | POA: Diagnosis not present

## 2015-03-19 DIAGNOSIS — R188 Other ascites: Secondary | ICD-10-CM | POA: Diagnosis not present

## 2015-03-19 LAB — BODY FLUID CELL COUNT WITH DIFFERENTIAL
Eos, Fluid: 0 %
Lymphs, Fluid: 19 %
Monocyte-Macrophage-Serous Fluid: 80 % (ref 50–90)
NEUTROPHIL FLUID: 1 % (ref 0–25)
Total Nucleated Cell Count, Fluid: 39 cu mm (ref 0–1000)

## 2015-03-19 LAB — GRAM STAIN

## 2015-03-19 LAB — ALBUMIN, FLUID (OTHER): Albumin, Fluid: <1 g/dL

## 2015-03-19 MED ORDER — LIDOCAINE HCL (PF) 1 % IJ SOLN
INTRAMUSCULAR | Status: AC
  Filled 2015-03-19: qty 10

## 2015-03-19 MED ORDER — ALBUMIN HUMAN 25 % IV SOLN
50.0000 g | Freq: Once | INTRAVENOUS | Status: AC
  Administered 2015-03-19: 50 g via INTRAVENOUS
  Filled 2015-03-19: qty 200

## 2015-03-19 NOTE — Procedures (Signed)
Ultrasound-guided diagnostic and therapeutic paracentesis performed yielding 7 liters (maximum ordered) of yellow  fluid. No immediate complications. A portion of the fluid was sent to the lab for preordered studies. The pt will receive IV albumin postprocedure.

## 2015-03-19 NOTE — Progress Notes (Signed)
Albumin 50 gm given pt tolerated well.

## 2015-03-24 LAB — CULTURE, BODY FLUID-BOTTLE

## 2015-03-24 LAB — CULTURE, BODY FLUID W GRAM STAIN -BOTTLE: Culture: NO GROWTH

## 2015-03-26 ENCOUNTER — Other Ambulatory Visit: Payer: Self-pay | Admitting: Internal Medicine

## 2015-03-29 ENCOUNTER — Other Ambulatory Visit (HOSPITAL_COMMUNITY): Payer: Self-pay | Admitting: Gastroenterology

## 2015-03-29 DIAGNOSIS — R188 Other ascites: Secondary | ICD-10-CM

## 2015-03-29 DIAGNOSIS — K7460 Unspecified cirrhosis of liver: Secondary | ICD-10-CM

## 2015-03-29 NOTE — Telephone Encounter (Signed)
Call around. He is now to get his diuretic medications from his gastroenterology specialist as they're preparing him for further liver treatment. He has a very delicate metabolic balance and we are not going to be involved in prescribing those medications.

## 2015-03-29 NOTE — Telephone Encounter (Signed)
Does patient need a f/u for lasix

## 2015-03-30 ENCOUNTER — Ambulatory Visit (HOSPITAL_COMMUNITY): Payer: BLUE CROSS/BLUE SHIELD

## 2015-03-30 ENCOUNTER — Encounter (HOSPITAL_COMMUNITY): Payer: BLUE CROSS/BLUE SHIELD

## 2015-03-30 DIAGNOSIS — B182 Chronic viral hepatitis C: Secondary | ICD-10-CM | POA: Insufficient documentation

## 2015-04-01 ENCOUNTER — Encounter (HOSPITAL_COMMUNITY)
Admission: RE | Admit: 2015-04-01 | Discharge: 2015-04-01 | Disposition: A | Discharge status: Home / Self Care | Payer: BLUE CROSS/BLUE SHIELD | Source: Ambulatory Visit | Attending: Gastroenterology | Admitting: Gastroenterology

## 2015-04-01 ENCOUNTER — Ambulatory Visit (HOSPITAL_COMMUNITY)
Admission: RE | Admit: 2015-04-01 | Discharge: 2015-04-01 | Disposition: A | Discharge status: Home / Self Care | Payer: BLUE CROSS/BLUE SHIELD | Source: Ambulatory Visit | Attending: Gastroenterology | Admitting: Gastroenterology

## 2015-04-01 DIAGNOSIS — K7460 Unspecified cirrhosis of liver: Secondary | ICD-10-CM | POA: Diagnosis not present

## 2015-04-01 DIAGNOSIS — R188 Other ascites: Secondary | ICD-10-CM

## 2015-04-01 MED ORDER — ALBUMIN HUMAN 25 % IV SOLN
50.0000 g | Freq: Once | INTRAVENOUS | Status: AC
  Administered 2015-04-01: 50 g via INTRAVENOUS
  Filled 2015-04-01: qty 200

## 2015-04-01 MED ORDER — SODIUM CHLORIDE 0.9 % IV SOLN
Freq: Once | INTRAVENOUS | Status: AC
  Administered 2015-04-01: 12:00:00 via INTRAVENOUS

## 2015-04-01 NOTE — Procedures (Signed)
Ultrasound-guided  therapeutic paracentesis performed yielding 7 liters (maximum ordered) of yellow fluid. No immediate complications.  The patient will receive IV albumin infusion post procedure.  

## 2015-04-01 NOTE — Discharge Instructions (Signed)
Albumin injection °What is this medicine? °ALBUMIN (al BYOO min) is used to treat or prevent shock following serious injury, bleeding, surgery, or burns by increasing the volume of blood plasma. This medicine can also replace low blood protein. °This medicine may be used for other purposes; ask your health care provider or pharmacist if you have questions. °What should I tell my health care provider before I take this medicine? °They need to know if you have any of the following conditions: °-anemia °-heart disease °-kidney disease °-an unusual or allergic reaction to albumin, other medicines, foods, dyes, or preservatives °-pregnant or trying to get pregnant °-breast-feeding °How should I use this medicine? °This medicine is for infusion into a vein. It is given by a health-care professional in a hospital or clinic. °Talk to your pediatrician regarding the use of this medicine in children. While this drug may be prescribed for selected conditions, precautions do apply. °Overdosage: If you think you have taken too much of this medicine contact a poison control center or emergency room at once. °NOTE: This medicine is only for you. Do not share this medicine with others. °What if I miss a dose? °This does not apply. °What may interact with this medicine? °Interactions are not expected. °This list may not describe all possible interactions. Give your health care provider a list of all the medicines, herbs, non-prescription drugs, or dietary supplements you use. Also tell them if you smoke, drink alcohol, or use illegal drugs. Some items may interact with your medicine. °What should I watch for while using this medicine? °Your condition will be closely monitored while you receive this medicine. °Some products are derived from human plasma, and there is a small risk that these products may contain certain types of virus or bacteria. All products are processed to kill most viruses and bacteria. If you have questions  concerning the risk of infections, discuss them with your doctor or health care professional. °What side effects may I notice from receiving this medicine? °Side effects that you should report to your doctor or health care professional as soon as possible: °-allergic reactions like skin rash, itching or hives, swelling of the face, lips, or tongue °-breathing problems °-changes in heartbeat °-fever, chills °-pain, redness or swelling at the injection site °-signs of viral infection including fever, drowsiness, chills, runny nose followed in about 2 weeks by a rash and joint pain °-tightness in the chest °Side effects that usually do not require medical attention (report to your doctor or health care professional if they continue or are bothersome): °-increased salivation °-nausea, vomiting °This list may not describe all possible side effects. Call your doctor for medical advice about side effects. You may report side effects to FDA at 1-800-FDA-1088. °Where should I keep my medicine? °This does not apply. You will not be given this medicine to store at home. °NOTE: This sheet is a summary. It may not cover all possible information. If you have questions about this medicine, talk to your doctor, pharmacist, or health care provider. °  °© 2016, [employer]vier/[employer]. (2007-04-11 10:18:55) ° °

## 2015-04-02 ENCOUNTER — Telehealth: Payer: Self-pay | Admitting: *Deleted

## 2015-04-02 ENCOUNTER — Other Ambulatory Visit: Payer: Self-pay | Admitting: *Deleted

## 2015-04-02 DIAGNOSIS — K7460 Unspecified cirrhosis of liver: Secondary | ICD-10-CM

## 2015-04-02 DIAGNOSIS — R188 Other ascites: Principal | ICD-10-CM

## 2015-04-02 NOTE — Telephone Encounter (Signed)
Dr[facility] Hillis RangeMatthew Kappus [facility] sent over an order for pt and he would like us to fax him the results. [phone] when they come in.  Per Dr. Merla Richesoolittle future orders where placed for CBC, CMP, and PT/INR.

## 2015-04-05 ENCOUNTER — Ambulatory Visit (INDEPENDENT_AMBULATORY_CARE_PROVIDER_SITE_OTHER): Payer: BLUE CROSS/BLUE SHIELD | Admitting: Internal Medicine

## 2015-04-05 VITALS — BP 128/70 | HR 85 | Temp 98.4°F | Resp 20 | Ht 73.0 in | Wt 263.0 lb

## 2015-04-05 DIAGNOSIS — M5416 Radiculopathy, lumbar region: Secondary | ICD-10-CM

## 2015-04-05 DIAGNOSIS — Z23 Encounter for immunization: Secondary | ICD-10-CM | POA: Diagnosis not present

## 2015-04-05 DIAGNOSIS — K7460 Unspecified cirrhosis of liver: Secondary | ICD-10-CM | POA: Diagnosis not present

## 2015-04-05 DIAGNOSIS — B182 Chronic viral hepatitis C: Secondary | ICD-10-CM | POA: Diagnosis not present

## 2015-04-05 DIAGNOSIS — G8929 Other chronic pain: Secondary | ICD-10-CM

## 2015-04-05 DIAGNOSIS — R188 Other ascites: Principal | ICD-10-CM

## 2015-04-05 LAB — CBC WITH DIFFERENTIAL/PLATELET
Basophils Absolute: 0.1 10*3/uL (ref 0.0–0.1)
Basophils Relative: 1 % (ref 0–1)
EOS PCT: 2 % (ref 0–5)
Eosinophils Absolute: 0.1 10*3/uL (ref 0.0–0.7)
HEMATOCRIT: 33.8 % — AB (ref 39.0–52.0)
HEMOGLOBIN: 12 g/dL — AB (ref 13.0–17.0)
LYMPHS ABS: 0.9 10*3/uL (ref 0.7–4.0)
LYMPHS PCT: 17 % (ref 12–46)
MCH: 29.7 pg (ref 26.0–34.0)
MCHC: 35.5 g/dL (ref 30.0–36.0)
MCV: 83.7 fL (ref 78.0–100.0)
MONO ABS: 0.9 10*3/uL (ref 0.1–1.0)
MONOS PCT: 16 % — AB (ref 3–12)
NEUTROS ABS: 3.5 10*3/uL (ref 1.7–7.7)
Neutrophils Relative %: 64 % (ref 43–77)
PLATELETS: 40 10*3/uL — AB (ref 150–400)
RBC: 4.04 MIL/uL — AB (ref 4.22–5.81)
RDW: 15.6 % — ABNORMAL HIGH (ref 11.5–15.5)
WBC: 5.4 10*3/uL (ref 4.0–10.5)

## 2015-04-05 LAB — COMPREHENSIVE METABOLIC PANEL
ALT: 15 U/L (ref 9–46)
AST: 36 U/L (ref 10–40)
Albumin: 2.9 g/dL — ABNORMAL LOW (ref 3.6–5.1)
Alkaline Phosphatase: 84 U/L (ref 40–115)
BUN: 15 mg/dL (ref 7–25)
CHLORIDE: 93 mmol/L — AB (ref 98–110)
CO2: 31 mmol/L (ref 20–31)
CREATININE: 0.87 mg/dL (ref 0.60–1.35)
Calcium: 8.1 mg/dL — ABNORMAL LOW (ref 8.6–10.3)
GLUCOSE: 110 mg/dL — AB (ref 65–99)
POTASSIUM: 3.7 mmol/L (ref 3.5–5.3)
SODIUM: 132 mmol/L — AB (ref 135–146)
TOTAL PROTEIN: 6.1 g/dL (ref 6.1–8.1)
Total Bilirubin: 2.6 mg/dL — ABNORMAL HIGH (ref 0.2–1.2)

## 2015-04-05 LAB — PROTIME-INR
INR: 1.57 — AB (ref <1.50)
Prothrombin Time: 19 seconds — ABNORMAL HIGH (ref 11.6–15.2)

## 2015-04-05 MED ORDER — OXYCODONE HCL 30 MG PO TABS: 30.0000 mg | ORAL_TABLET | ORAL | Status: DC | PRN

## 2015-04-05 NOTE — Patient Instructions (Signed)
Today:Prevnar given  Hep B 40mcg given as 2 injections of 5320mcg/ml each of energixB(recombinant)

## 2015-04-05 NOTE — Progress Notes (Signed)
   Subjective:    Patient ID: Mike Sosa, male    DOB: Dec 25, 1975, 40 y.o.   MRN: [id number]  HPI 1. Call patient and instruct him to go to 200mg  daily aldactone 2. Continue 80mg  lasix BID 3. Stop potassium chloride 4. CMP, CBC, INR early next week, faxed to us. He needs to call you mid week to let you know we should have received these 5. I have now ordered an US with doppler - this is to eval his portal vein and make sure it is not obstructed causing the swelling 6. Similar reason for #5, I am getting a TTE (echo)  He wants a different GI here locally inst of Dr Dulce Sellarutlaw  nic patch works, still not smoking   Review of Systems     Objective:   Physical Exam        Assessment & Plan:

## 2015-04-05 NOTE — Progress Notes (Signed)
Subjective:  By signing my name below, I, Mike Sosa, attest that this documentation has been prepared under the direction and in the presence of Mike Siaobert Kiel Cockerell, MD. Electronically Signed: Stann Oresung-Kai Sosa, Scribe. 04/05/2015 , 4:22 PM .  Patient was seen in Room 12 .   Patient ID: Mike Sosa, male    DOB: 05/31/1975, 40 y.o.   MRN: [id number] Chief Complaint  Patient presents with  . Medication Refill    oxycodone   . Other    blood work   HPI Mike Sosa is a 40 y.o. male who has h/o hepatitis C [facility] for medication refill and needing blood work to be done.  He's stopped seeing Dr. Dulce Sellarutlaw. He was seen by [facility] Hillis RangeMatthew [facility] days ago. He was [facility] and needs blood work for potassium done today. He was also recommended to continue furosemide 80mg  BID, and increase his aldactone to 200mg  daily. He was instructed to weigh himself daily. He is scheduled to have a large volume paracentesis in 3 days. He's still wearing nicotine patch, but denies any smoking.   Last note from [facility] Dr Guido SanderKappus 2/28: 1. Call patient and instruct him to go to 200mg  daily aldactone 2. Continue 80mg  lasix BID 3. Stop potassium chloride 4. CMP, CBC, INR early next week, faxed to us. He needs to call you mid week to let you know we should have received these 5. I have now ordered an US with doppler - this is to eval his portal vein and make sure it is not obstructed causing the swelling 6. Similar reason for #5, I am getting a TTE (echo)   He has decided he wants a different GI here locally inst of Dr Hughie Clossutlaw--as assigned in hospital  No Etoh  Patient Active Problem List   Diagnosis Date Noted  . Chronic hepatitis C virus infection (HCC) 03/30/2015  . Cirrhosis of liver with ascites (HCC) 02/13/2015  . Chronic hepatitis C without hepatic coma (HCC) 02/13/2015  . Ascites   . Hepatitis C antibody test positive   . Tobacco abuse   . Cirrhosis (HCC)  01/26/2015  . HTN (hypertension) 11/22/2014  . Edema 11/22/2014  . Umbilical hernia without obstruction and without gangrene 05/26/2014  . Family hx of colon cancer 03/31/2014  . Family hx of prostate cancer 03/31/2014  . Smoker 02/09/2014  . BMI 31.0-31.9,adult 02/09/2014  . Chronic radicular lumbar pain 02/09/2014  . Encounter for chronic pain management 02/09/2014  . Elevated BP 02/09/2014  He continues to require the same level of chronic pain management that he has for the several months he has been in our practice. Once again, as far as I'm concerned his past evaluation is incomplete and there may be a surgical remedy to his problem at some point. See initial walk-in evaluation in our clinic 02/09/2014. He failed to follow-up with orthopedic referrals and stall his MRI following that initial visit for a number of reasons including his traveling to work. Finally had his MRI in August=IMPRESSION: Chronic degenerative disc disease at L5-S1 with a small broad-based disc protrusion without neural impingement. Otherwise, essentially normal exam At this point he has been on pain medication for more than a decade--he was first requested to have surgery in 2006 but declined.   Note low level positive ANA/speckled and slightly abnormal [facility]  Current outpatient prescriptions:  .  furosemide (LASIX) 40 MG tablet, TAKE ONE TABLET  BY MOUTH TWICE DAILY, Disp: 60 tablet, Rfl: 0 .  gabapentin (NEURONTIN) 300 MG capsule, Take 1-2 capsules (300-600 mg total) by mouth at bedtime., Disp: 60 capsule, Rfl: 5 .  nicotine (NICODERM CQ - DOSED IN MG/24 HOURS) 14 mg/24hr patch, Place 1 patch (14 mg total) onto the skin daily., Disp: 14 patch, Rfl: 0 .  oxycodone (ROXICODONE) 30 MG immediate release tablet, Take 1 tablet (30 mg total) by mouth every 4 (four) hours as needed for pain. For 30d after signed, Disp: 180 tablet, Rfl: 0 .  oxycodone (ROXICODONE) 30 MG immediate release  tablet, Take 1 tablet (30 mg total) by mouth every 4 (four) hours as needed for pain., Disp: 180 tablet, Rfl: 0 .  spironolactone (ALDACTONE) 100 MG tablet, Take 1 tablet (100 mg total) by mouth daily., Disp: 30 tablet, Rfl: 2 .  VITAMIN K PO, Take 1 tablet by mouth daily., Disp: , Rfl:  .  aspirin-acetaminophen-caffeine (EXCEDRIN MIGRAINE) 250-250-65 MG tablet, Take 1 tablet by mouth every 8 (eight) hours as needed for headache or migraine. Reported on 04/05/2015, Disp: , Rfl:  .  calcium carbonate (TUMS - DOSED IN MG ELEMENTAL CALCIUM) 500 MG chewable tablet, Chew 1 tablet by mouth daily as needed for indigestion or heartburn. Reported on 04/05/2015, Disp: , Rfl:  .  MILK THISTLE PO, Take 1 tablet by mouth daily. Reported on 04/05/2015, Disp: , Rfl:  .  Multiple Vitamin (MULTIVITAMIN WITH MINERALS) TABS tablet, Take 1 tablet by mouth daily. Reported on 04/05/2015, Disp: , Rfl:  .  potassium chloride SA (K-DUR,KLOR-CON) 20 MEQ tablet, Take 1 tablet (20 mEq total) by mouth daily. (Patient not taking: Reported on 04/05/2015), Disp: 30 tablet, Rfl: 3  Review of Systems  Constitutional: Negative for fever, chills and fatigue.  Respiratory: Negative for cough and shortness of breath.   Cardiovascular: Negative for chest pain.  Gastrointestinal: Negative for nausea, vomiting and diarrhea.  Genitourinary: Negative for frequency and difficulty urinating.  Skin: Negative for rash and wound.  Neurological: Negative for dizziness, weakness, light-headedness and headaches.  He has continued to reaccumulate abdominal ascites and is scheduled for another paracentesis    Wt Readings from Last 3 Encounters:  04/05/15 263 lb (119.296 kg)  04/01/15 240 lb (108.863 kg)  02/12/15 240 lb (108.863 kg)    Objective:   Physical Exam  Constitutional: He is oriented to person, place, and time. He appears well-developed and well-nourished. No distress.  HENT:  Head: Normocephalic and atraumatic.  Eyes: EOM are normal.  Pupils are equal, round, and reactive to light. No scleral icterus.  Neck: Neck supple.  Cardiovascular: Normal rate.   Pulmonary/Chest: Effort normal. No respiratory distress.  Abdominal: He exhibits distension. There is no tenderness.  Musculoskeletal: Normal range of motion.  Neurological: He is alert and oriented to person, place, and time. No cranial nerve deficit.  Skin: Skin is warm and dry.  Psychiatric: He has a normal mood and affect. Thought content normal.  Nursing note and vitals reviewed.   BP 128/70 mmHg  Pulse 85  Temp(Src) 98.4 F (36.9 C) (Oral)  Resp 20  Ht  (1.854 m)  Wt 263 lb (119.296 kg)  BMI 34.71 kg/m2  SpO2 96%    Assessment & Plan:  Cirrhosis of liver with ascites, unspecified hepatic cirrhosis type (HCC) - Plan: Comprehensive metabolic panel, CBC with Differential/Platelet, Protime-INR, Ambulatory referral to Gastroenterology  Chronic hepatitis C without hepatic coma (HCC) - Plan: Ambulatory referral to [facility]  Chronic radicular  lumbar pain--meds refilled for now  Need for hepatitis B vaccine--- given 40 g recomment a vaccine today for his initial dose  Need for Prevnar 13-given today  Meds ordered this encounter  Medications  . oxycodone (ROXICODONE) 30 MG immediate release tablet    Sig: Take 1 tablet (30 mg total) by mouth every 4 (four) hours as needed for pain. For 30d after signed    Dispense:  180 tablet    Refill:  0  . oxycodone (ROXICODONE) 30 MG immediate release tablet    Sig: Take 1 tablet (30 mg total) by mouth every 4 (four) hours as needed for pain.    Dispense:  180 tablet    Refill:  0   Follow-up 2 months  Labs to Dr Doreene Adas Matilde Bash, MD  [address] MEDICINE [place], [place] [address]  Phone: [phone]  Fax: [phone]    I have completed the patient encounter in its entirety as documented by the scribe, with editing by me where necessary. Cacey Willow P. Merla Riches,  M.D.

## 2015-04-06 ENCOUNTER — Telehealth: Payer: Self-pay | Admitting: *Deleted

## 2015-04-06 ENCOUNTER — Other Ambulatory Visit (HOSPITAL_COMMUNITY): Payer: Self-pay | Admitting: Gastroenterology

## 2015-04-06 ENCOUNTER — Telehealth: Payer: Self-pay

## 2015-04-06 DIAGNOSIS — R188 Other ascites: Principal | ICD-10-CM

## 2015-04-06 DIAGNOSIS — K7460 Unspecified cirrhosis of liver: Secondary | ICD-10-CM

## 2015-04-06 NOTE — Telephone Encounter (Signed)
Done. Faxed today.

## 2015-04-06 NOTE — Telephone Encounter (Signed)
Hilda LiasMarie from [facility] with Dr Guido SanderKappus states they have given orders for pt to have blood work done and would like to have the results faxed to [phone] and you may call her at [phone]

## 2015-04-06 NOTE — Telephone Encounter (Signed)
-----   Message from Tonye Pearsonobert P Doolittle, MD sent at 04/05/2015 11:14 PM EST ----- Need to send OV to Dr [facility]appus at [facility], Matilde BashMatthew Robert, MD  [address] [facility] [place]  [place], [place] [address]  Phone: [phone]  Fax: [phone]

## 2015-04-06 NOTE — Telephone Encounter (Signed)
[facility] to [facility]

## 2015-04-06 NOTE — Telephone Encounter (Signed)
Allison from [facility] called and stated that pt platelet count was low at 40.

## 2015-04-08 NOTE — Telephone Encounter (Signed)
DONE

## 2015-04-13 ENCOUNTER — Encounter (HOSPITAL_COMMUNITY)
Admission: RE | Admit: 2015-04-13 | Discharge: 2015-04-13 | Disposition: A | Discharge status: Home / Self Care | Payer: BLUE CROSS/BLUE SHIELD | Source: Ambulatory Visit | Attending: Gastroenterology | Admitting: Gastroenterology

## 2015-04-13 ENCOUNTER — Encounter (HOSPITAL_COMMUNITY): Payer: Self-pay

## 2015-04-13 ENCOUNTER — Ambulatory Visit (HOSPITAL_COMMUNITY)
Admission: RE | Admit: 2015-04-13 | Discharge: 2015-04-13 | Disposition: A | Discharge status: Home / Self Care | Payer: BLUE CROSS/BLUE SHIELD | Source: Ambulatory Visit | Attending: Gastroenterology | Admitting: Gastroenterology

## 2015-04-13 DIAGNOSIS — R188 Other ascites: Secondary | ICD-10-CM | POA: Diagnosis not present

## 2015-04-13 DIAGNOSIS — K7460 Unspecified cirrhosis of liver: Secondary | ICD-10-CM | POA: Diagnosis not present

## 2015-04-13 MED ORDER — ALBUMIN HUMAN 25 % IV SOLN
50.0000 g | Freq: Once | INTRAVENOUS | Status: AC
  Administered 2015-04-13: 50 g via INTRAVENOUS
  Filled 2015-04-13: qty 200

## 2015-04-13 MED ORDER — SODIUM CHLORIDE 0.9 % IV SOLN
Freq: Once | INTRAVENOUS | Status: AC
  Administered 2015-04-13: 12:00:00 via INTRAVENOUS

## 2015-04-13 NOTE — Procedures (Signed)
Ultrasound-guided therapeutic paracentesis performed yielding 8 liters (maximum ordered) of yellow fluid. No immediate complications. The pt will receive IV albumin infusion postprocedure.

## 2015-04-13 NOTE — Discharge Instructions (Signed)
Albumin injection °What is this medicine? °ALBUMIN (al BYOO min) is used to treat or prevent shock following serious injury, bleeding, surgery, or burns by increasing the volume of blood plasma. This medicine can also replace low blood protein. °This medicine may be used for other purposes; ask your health care provider or pharmacist if you have questions. °What should I tell my health care provider before I take this medicine? °They need to know if you have any of the following conditions: °-anemia °-heart disease °-kidney disease °-an unusual or allergic reaction to albumin, other medicines, foods, dyes, or preservatives °-pregnant or trying to get pregnant °-breast-feeding °How should I use this medicine? °This medicine is for infusion into a vein. It is given by a health-care professional in a hospital or clinic. °Talk to your pediatrician regarding the use of this medicine in children. While this drug may be prescribed for selected conditions, precautions do apply. °Overdosage: If you think you have taken too much of this medicine contact a poison control center or emergency room at once. °NOTE: This medicine is only for you. Do not share this medicine with others. °What if I miss a dose? °This does not apply. °What may interact with this medicine? °Interactions are not expected. °This list may not describe all possible interactions. Give your health care provider a list of all the medicines, herbs, non-prescription drugs, or dietary supplements you use. Also tell them if you smoke, drink alcohol, or use illegal drugs. Some items may interact with your medicine. °What should I watch for while using this medicine? °Your condition will be closely monitored while you receive this medicine. °Some products are derived from human plasma, and there is a small risk that these products may contain certain types of virus or bacteria. All products are processed to kill most viruses and bacteria. If you have questions  concerning the risk of infections, discuss them with your doctor or health care professional. °What side effects may I notice from receiving this medicine? °Side effects that you should report to your doctor or health care professional as soon as possible: °-allergic reactions like skin rash, itching or hives, swelling of the face, lips, or tongue °-breathing problems °-changes in heartbeat °-fever, chills °-pain, redness or swelling at the injection site °-signs of viral infection including fever, drowsiness, chills, runny nose followed in about 2 weeks by a rash and joint pain °-tightness in the chest °Side effects that usually do not require medical attention (report to your doctor or health care professional if they continue or are bothersome): °-increased salivation °-nausea, vomiting °This list may not describe all possible side effects. Call your doctor for medical advice about side effects. You may report side effects to FDA at 1-800-FDA-1088. °Where should I keep my medicine? °This does not apply. You will not be given this medicine to store at home. °NOTE: This sheet is a summary. It may not cover all possible information. If you have questions about this medicine, talk to your doctor, pharmacist, or health care provider. °  °© 2016, [employer]vier/[employer]. (2007-04-11 10:18:55) ° °

## 2015-04-14 ENCOUNTER — Telehealth: Payer: Self-pay

## 2015-04-14 NOTE — Telephone Encounter (Signed)
Pt states he have the Flu and since he have liver problems, would like to know what he can take without coming in. Please call pt at [phone]     [facility] [address] [facility] [place]

## 2015-04-16 ENCOUNTER — Ambulatory Visit (INDEPENDENT_AMBULATORY_CARE_PROVIDER_SITE_OTHER): Payer: BLUE CROSS/BLUE SHIELD | Admitting: Internal Medicine

## 2015-04-16 VITALS — BP 128/82 | HR 95 | Temp 98.1°F | Resp 18 | Ht 73.0 in | Wt 278.0 lb

## 2015-04-16 DIAGNOSIS — K7460 Unspecified cirrhosis of liver: Secondary | ICD-10-CM

## 2015-04-16 DIAGNOSIS — J111 Influenza due to unidentified influenza virus with other respiratory manifestations: Secondary | ICD-10-CM | POA: Diagnosis not present

## 2015-04-16 DIAGNOSIS — R188 Other ascites: Principal | ICD-10-CM

## 2015-04-16 LAB — POCT INFLUENZA A/B
INFLUENZA A, POC: NEGATIVE
Influenza B, POC: POSITIVE — AB

## 2015-04-16 MED ORDER — OSELTAMIVIR PHOSPHATE 75 MG PO CAPS: 75.0000 mg | ORAL_CAPSULE | Freq: Two times a day (BID) | ORAL | Status: DC

## 2015-04-16 NOTE — Telephone Encounter (Signed)
SPoke with pt, advised to RTC. 

## 2015-04-16 NOTE — Progress Notes (Deleted)
[facility] [address], [place] [address] [phone]  Date:  04/16/2015   Name:  Mike Sosa   DOB:  1975/07/07   MRN:  [id number]  PCP:  Tonye PearsonOLITTLE, ROBERT P, MD    Chief Complaint: Cough and Other   History of Present Illness:  This is a 40 y.o. male with PMH *** who is presenting with cough x 2 days.  He is also wanting lab work today.  Review of Systems:  Review of Systems  Patient Active Problem List   Diagnosis Date Noted  . Chronic hepatitis C virus infection (HCC) 03/30/2015  . Cirrhosis of liver with ascites (HCC) 02/13/2015  . Chronic hepatitis C without hepatic coma (HCC) 02/13/2015  . Ascites   . Hepatitis C antibody test positive   . Tobacco abuse   . Cirrhosis (HCC) 01/26/2015  . HTN (hypertension) 11/22/2014  . Edema 11/22/2014  . Umbilical hernia without obstruction and without gangrene 05/26/2014  . Family hx of colon cancer 03/31/2014  . Family hx of prostate cancer 03/31/2014  . Smoker 02/09/2014  . BMI 31.0-31.9,adult 02/09/2014  . Chronic radicular lumbar pain 02/09/2014  . Encounter for chronic pain management 02/09/2014  . Elevated BP 02/09/2014    Prior to Admission medications   Medication Sig Start Date End Date Taking? Authorizing Provider  calcium carbonate (TUMS - DOSED IN MG ELEMENTAL CALCIUM) 500 MG chewable tablet Chew 1 tablet by mouth daily as needed for indigestion or heartburn. Reported on 04/16/2015   Yes Historical Provider, MD  furosemide (LASIX) 40 MG tablet TAKE ONE TABLET BY MOUTH TWICE DAILY 03/30/15  Yes Tonye Pearsonobert P Doolittle, MD  gabapentin (NEURONTIN) 300 MG capsule Take 1-2 capsules (300-600 mg total) by mouth at bedtime. 01/18/15  Yes Tonye Pearsonobert P Doolittle, MD  nicotine (NICODERM CQ - DOSED IN MG/24 HOURS) 14 mg/24hr patch Place 1 patch (14 mg total) onto the skin daily. 01/28/15  Yes Alexander Fredric MareJ Karamalegos, DO  oxycodone (ROXICODONE) 30 MG immediate release tablet Take 1 tablet (30 mg total) by  mouth every 4 (four) hours as needed for pain. For 30d after signed 04/05/15  Yes Tonye Pearsonobert P Doolittle, MD  oxycodone (ROXICODONE) 30 MG immediate release tablet Take 1 tablet (30 mg total) by mouth every 4 (four) hours as needed for pain. 04/05/15  Yes Tonye Pearsonobert P Doolittle, MD  spironolactone (ALDACTONE) 100 MG tablet Take 1 tablet (100 mg total) by mouth daily. 01/19/15  Yes Tonye Pearsonobert P Doolittle, MD  aspirin-acetaminophen-caffeine (EXCEDRIN MIGRAINE) [id number] MG tablet Take 1 tablet by mouth every 8 (eight) hours as needed for headache or migraine. Reported on 04/16/2015    Historical Provider, MD  Multiple Vitamin (MULTIVITAMIN WITH MINERALS) TABS tablet Take 1 tablet by mouth daily. Reported on 04/16/2015    Historical Provider, MD    No Known Allergies  History reviewed. No pertinent past surgical history.  Social History  Substance Use Topics  . Smoking status: Current Every Day Smoker -- 0.50 packs/day for 23 years    Types: Cigarettes  . Smokeless tobacco: Never Used  . Alcohol Use: 0.0 oz/week    0 Standard drinks or equivalent per week    Family History  Problem Relation Age of Onset  . Hyperlipidemia Father     Medication list has been reviewed and updated.  Physical Examination:  Physical Exam  BP 128/82 mmHg  Pulse 95  Temp(Src) 98.1 F (36.7 C)  Resp 18  Ht 6\' 1"  (1.854 m)  Wt  278 lb (126.1 kg)  BMI 36.69 kg/m2  SpO2 92%  Assessment and Plan:

## 2015-04-16 NOTE — Progress Notes (Signed)
Subjective:  By signing my name below, I, Mike Sosa, attest that this documentation has been prepared under the direction and in the presence of Mike Sia, MD.  Electronically Signed: Andrew Sosa, ED Scribe. 04/16/2015. 6:59 PM.   Patient ID: Mike Sosa, male    DOB: 1975/10/07, 40 y.o.   MRN: [id number]  HPI   Chief Complaint  Patient presents with  . Cough    x 2 days  . Other    Lab work    HPI Comments: Mike Sosa is a 40 y.o. male who [facility] complaining of flu like symptoms that began 2 days ago. Pt states symptoms started with chills 2 days ago stating he was unable to get warm. Over the past 2 days symptoms worsened with body aches, chest congestion, cough and  fever. He received pneumonia vaccines a couple days ago. He reports exposure to the flu from family.   Pt states he needs blood work done for his paracentesis.  Patient Active Problem List   Diagnosis Date Noted  . Chronic hepatitis C virus infection (HCC) 03/30/2015  . Cirrhosis of liver with ascites (HCC) 02/13/2015  . Chronic hepatitis C without hepatic coma (HCC) 02/13/2015  . Ascites   . Hepatitis C antibody test positive   . Tobacco abuse   . Cirrhosis (HCC) 01/26/2015  . HTN (hypertension) 11/22/2014  . Edema 11/22/2014  . Umbilical hernia without obstruction and without gangrene 05/26/2014  . Family hx of colon cancer 03/31/2014  . Family hx of prostate cancer 03/31/2014  . Smoker 02/09/2014  . BMI 31.0-31.9,adult 02/09/2014  . Chronic radicular lumbar pain 02/09/2014  . Encounter for chronic pain management 02/09/2014  . Elevated BP 02/09/2014   History reviewed. No pertinent past surgical history. Past Medical History  Diagnosis Date  . Depression   . Anxiety   . Tobacco abuse   . Hepatitis C   . Cirrhosis (HCC)   . DDD (degenerative disc disease), lumbar   . Chronic pain syndrome    Prior to Admission medications   Medication Sig Start  Date End Date Taking? Authorizing Provider  calcium carbonate (TUMS - DOSED IN MG ELEMENTAL CALCIUM) 500 MG chewable tablet Chew 1 tablet by mouth daily as needed for indigestion or heartburn. Reported on 04/16/2015   Yes Historical Provider, MD  furosemide (LASIX) 40 MG tablet TAKE ONE TABLET BY MOUTH TWICE DAILY 03/30/15  Yes Tonye Pearson, MD  gabapentin (NEURONTIN) 300 MG capsule Take 1-2 capsules (300-600 mg total) by mouth at bedtime. 01/18/15  Yes Tonye Pearson, MD  nicotine (NICODERM CQ - DOSED IN MG/24 HOURS) 14 mg/24hr patch Place 1 patch (14 mg total) onto the skin daily. 01/28/15  Yes Alexander Fredric Mare, DO  oxycodone (ROXICODONE) 30 MG immediate release tablet Take 1 tablet (30 mg total) by mouth every 4 (four) hours as needed for pain. For 30d after signed 04/05/15  Yes Tonye Pearson, MD  oxycodone (ROXICODONE) 30 MG immediate release tablet Take 1 tablet (30 mg total) by mouth every 4 (four) hours as needed for pain. 04/05/15  Yes Tonye Pearson, MD  spironolactone (ALDACTONE) 100 MG tablet Take 1 tablet (100 mg total) by mouth daily. 01/19/15  Yes Tonye Pearson, MD  aspirin-acetaminophen-caffeine (EXCEDRIN MIGRAINE) [phone] MG tablet Take 1 tablet by mouth every 8 (eight) hours as needed for headache or migraine. Reported on 04/16/2015    Historical Provider, MD  Multiple Vitamin (  MULTIVITAMIN WITH MINERALS) TABS tablet Take 1 tablet by mouth daily. Reported on 04/16/2015    Historical Provider, MD    Review of Systems  Constitutional: Positive for fever and chills.  Respiratory: Positive for cough.        Objective:   Physical Exam  Constitutional: He is oriented to person, place, and time. He appears well-developed and well-nourished. No distress.  HENT:  Head: Normocephalic and atraumatic.  Eyes: Conjunctivae and EOM are normal.  Neck: Neck supple.  Cardiovascular: Normal rate, regular rhythm and normal heart sounds.   Pulmonary/Chest: Effort  normal and breath sounds normal. He has no wheezes. He has no rales.  Musculoskeletal: Normal range of motion.  Neurological: He is alert and oriented to person, place, and time.  Skin: Skin is warm and dry.  Psychiatric: He has a normal mood and affect. His behavior is normal.  Nursing note and vitals reviewed.  Filed Vitals:   04/16/15 1740  BP: 128/82  Pulse: 95  Temp: 98.1 F (36.7 C)  Resp: 18  Height: 6\' 1"  (1.854 m)  Weight: 278 lb (126.1 kg)  SpO2: 92%    Assessment & Plan:  Cirrhosis of liver with ascites, unspecified hepatic cirrhosis type (HCC) - Plan: CBC with Differential/Platelet, COMPLETE METABOLIC PANEL WITH GFR, Protime-INR, will be done after hydration since blood draw unsucc today  Flu syndrome - Plan: POCT Influenza A/B, Tamiflu/home/fever and fluid control/f-u 48-72  Meds ordered this encounter  Medications  . oseltamivir (TAMIFLU) 75 MG capsule    Sig: Take 1 capsule (75 mg total) by mouth 2 (two) times daily.    Dispense:  10 capsule    Refill:  0

## 2015-04-21 ENCOUNTER — Other Ambulatory Visit (INDEPENDENT_AMBULATORY_CARE_PROVIDER_SITE_OTHER): Payer: BLUE CROSS/BLUE SHIELD | Admitting: *Deleted

## 2015-04-21 ENCOUNTER — Encounter: Payer: Self-pay | Admitting: *Deleted

## 2015-04-21 DIAGNOSIS — R188 Other ascites: Principal | ICD-10-CM

## 2015-04-21 DIAGNOSIS — K7460 Unspecified cirrhosis of liver: Secondary | ICD-10-CM

## 2015-04-21 LAB — COMPLETE METABOLIC PANEL WITH GFR
ALBUMIN: 2.8 g/dL — AB (ref 3.6–5.1)
ALK PHOS: 92 U/L (ref 40–115)
ALT: 13 U/L (ref 9–46)
AST: 33 U/L (ref 10–40)
BUN: 17 mg/dL (ref 7–25)
CALCIUM: 8.1 mg/dL — AB (ref 8.6–10.3)
CO2: 33 mmol/L — ABNORMAL HIGH (ref 20–31)
CREATININE: 0.91 mg/dL (ref 0.60–1.35)
Chloride: 93 mmol/L — ABNORMAL LOW (ref 98–110)
GFR, Est African American: >89 mL/min (ref >=60)
GFR, Est Non African American: >89 mL/min (ref >=60)
GLUCOSE: 91 mg/dL (ref 65–99)
POTASSIUM: 3.9 mmol/L (ref 3.5–5.3)
SODIUM: 133 mmol/L — AB (ref 135–146)
TOTAL PROTEIN: 6.3 g/dL (ref 6.1–8.1)
Total Bilirubin: 2.1 mg/dL — ABNORMAL HIGH (ref 0.2–1.2)

## 2015-04-21 LAB — CBC WITH DIFFERENTIAL/PLATELET
BASOS ABS: 0.1 10*3/uL (ref 0.0–0.1)
BASOS PCT: 1 % (ref 0–1)
Eosinophils Absolute: 0.1 10*3/uL (ref 0.0–0.7)
Eosinophils Relative: 2 % (ref 0–5)
HEMATOCRIT: 35.5 % — AB (ref 39.0–52.0)
HEMOGLOBIN: 12.4 g/dL — AB (ref 13.0–17.0)
LYMPHS PCT: 12 % (ref 12–46)
Lymphs Abs: 0.7 10*3/uL (ref 0.7–4.0)
MCH: 29.1 pg (ref 26.0–34.0)
MCHC: 34.9 g/dL (ref 30.0–36.0)
MCV: 83.3 fL (ref 78.0–100.0)
Monocytes Absolute: 1.1 10*3/uL — ABNORMAL HIGH (ref 0.1–1.0)
Monocytes Relative: 18 % — ABNORMAL HIGH (ref 3–12)
NEUTROS ABS: 4 10*3/uL (ref 1.7–7.7)
NEUTROS PCT: 67 % (ref 43–77)
Platelets: 52 10*3/uL — ABNORMAL LOW (ref 150–400)
RBC: 4.26 MIL/uL (ref 4.22–5.81)
RDW: 15.8 % — ABNORMAL HIGH (ref 11.5–15.5)
WBC: 5.9 10*3/uL (ref 4.0–10.5)

## 2015-04-21 NOTE — Telephone Encounter (Signed)
faxed

## 2015-04-22 LAB — PROTIME-INR
INR: 1.56 — ABNORMAL HIGH (ref <1.50)
Prothrombin Time: 18.9 seconds — ABNORMAL HIGH (ref 11.6–15.2)

## 2015-04-23 ENCOUNTER — Other Ambulatory Visit (HOSPITAL_COMMUNITY): Payer: Self-pay | Admitting: Gastroenterology

## 2015-04-23 ENCOUNTER — Encounter: Payer: Self-pay | Admitting: *Deleted

## 2015-04-23 ENCOUNTER — Telehealth: Payer: Self-pay | Admitting: *Deleted

## 2015-04-23 DIAGNOSIS — R188 Other ascites: Secondary | ICD-10-CM

## 2015-04-23 NOTE — Telephone Encounter (Signed)
ERROR

## 2015-04-26 ENCOUNTER — Telehealth: Payer: Self-pay

## 2015-04-26 NOTE — Telephone Encounter (Signed)
Msg is for Mike Sosa, he wants a referral to [facility] with Dr. Elspeth Chohopon  Please advise  [phone]

## 2015-04-27 NOTE — Telephone Encounter (Signed)
Referral already placed.

## 2015-04-30 ENCOUNTER — Ambulatory Visit (HOSPITAL_COMMUNITY): Payer: BLUE CROSS/BLUE SHIELD | Attending: Gastroenterology

## 2015-05-09 ENCOUNTER — Other Ambulatory Visit: Payer: Self-pay | Admitting: Internal Medicine

## 2015-05-09 ENCOUNTER — Other Ambulatory Visit (INDEPENDENT_AMBULATORY_CARE_PROVIDER_SITE_OTHER): Payer: BLUE CROSS/BLUE SHIELD | Admitting: *Deleted

## 2015-05-09 DIAGNOSIS — N528 Other male erectile dysfunction: Secondary | ICD-10-CM

## 2015-05-09 DIAGNOSIS — K745 Biliary cirrhosis, unspecified: Secondary | ICD-10-CM | POA: Diagnosis not present

## 2015-05-09 LAB — COMPLETE METABOLIC PANEL WITH GFR
ALT: 15 U/L (ref 9–46)
AST: 33 U/L (ref 10–40)
Albumin: 2.7 g/dL — ABNORMAL LOW (ref 3.6–5.1)
Alkaline Phosphatase: 100 U/L (ref 40–115)
BILIRUBIN TOTAL: 1.6 mg/dL — AB (ref 0.2–1.2)
BUN: 21 mg/dL (ref 7–25)
CALCIUM: 7.8 mg/dL — AB (ref 8.6–10.3)
CHLORIDE: 92 mmol/L — AB (ref 98–110)
CO2: 29 mmol/L (ref 20–31)
CREATININE: 1.07 mg/dL (ref 0.60–1.35)
GFR, Est African American: >89 mL/min (ref >=60)
GFR, Est Non African American: 86 mL/min (ref >=60)
Glucose, Bld: 131 mg/dL — ABNORMAL HIGH (ref 65–99)
Potassium: 4.3 mmol/L (ref 3.5–5.3)
Sodium: 128 mmol/L — ABNORMAL LOW (ref 135–146)
TOTAL PROTEIN: 6.7 g/dL (ref 6.1–8.1)

## 2015-05-09 LAB — PROTIME-INR
INR: 1.57 — ABNORMAL HIGH (ref <1.50)
Prothrombin Time: 19 seconds — ABNORMAL HIGH (ref 11.6–15.2)

## 2015-05-09 MED ORDER — SILDENAFIL CITRATE 100 MG PO TABS: 50.0000 mg | ORAL_TABLET | Freq: Every day | ORAL | Status: AC | PRN

## 2015-05-09 NOTE — Progress Notes (Signed)
ED worsening over span of current treatment hep c plus pain meds Meds ordered this encounter  Medications  . sildenafil (VIAGRA) 100 MG tablet    Sig: Take 0.5-1 tablets (50-100 mg total) by mouth daily as needed for erectile dysfunction.    Dispense:  5 tablet    Refill:  11   Ck testos as well

## 2015-05-09 NOTE — Addendum Note (Signed)
Addended by: Damita LackLORING, DONNA S on: 05/09/2015 04:05 PM   Modules accepted: Orders

## 2015-05-10 LAB — CBC WITH DIFFERENTIAL/PLATELET
BASOS ABS: 52 {cells}/uL (ref 0–200)
Basophils Relative: 1 %
EOS ABS: 104 {cells}/uL (ref 15–500)
Eosinophils Relative: 2 %
HCT: 34.4 % — ABNORMAL LOW (ref 38.5–50.0)
Hemoglobin: 12.1 g/dL — ABNORMAL LOW (ref 13.2–17.1)
LYMPHS PCT: 13 %
Lymphs Abs: 676 cells/uL — ABNORMAL LOW (ref 850–3900)
MCH: 29.9 pg (ref 27.0–33.0)
MCHC: 35.2 g/dL (ref 32.0–36.0)
MCV: 84.9 fL (ref 80.0–100.0)
MONOS PCT: 15 %
Monocytes Absolute: 780 cells/uL (ref 200–950)
Neutro Abs: 3588 cells/uL (ref 1500–7800)
Neutrophils Relative %: 69 %
PLATELETS: 54 10*3/uL — AB (ref 140–400)
RBC: 4.05 MIL/uL — ABNORMAL LOW (ref 4.20–5.80)
RDW: 15.4 % — AB (ref 11.0–15.0)
WBC: 5.2 10*3/uL (ref 3.8–10.8)

## 2015-05-15 LAB — TESTOS,TOTAL,FREE AND SHBG (FEMALE)
Sex Hormone Binding Glob.: 50 nmol/L (ref 10–50)
Testosterone, Free: 7.9 pg/mL — ABNORMAL LOW (ref 35.0–155.0)
Testosterone,Total,LC/MS/MS: 50 ng/dL — ABNORMAL LOW (ref 250–1100)

## 2015-05-20 ENCOUNTER — Other Ambulatory Visit: Payer: Self-pay | Admitting: Gastroenterology

## 2015-05-20 ENCOUNTER — Encounter (HOSPITAL_COMMUNITY): Payer: Self-pay | Admitting: *Deleted

## 2015-05-23 ENCOUNTER — Ambulatory Visit (INDEPENDENT_AMBULATORY_CARE_PROVIDER_SITE_OTHER): Payer: BLUE CROSS/BLUE SHIELD | Admitting: Internal Medicine

## 2015-05-23 VITALS — BP 122/84 | HR 100 | Temp 98.2°F | Resp 16 | Ht 71.5 in | Wt 212.0 lb

## 2015-05-23 DIAGNOSIS — B182 Chronic viral hepatitis C: Secondary | ICD-10-CM | POA: Diagnosis not present

## 2015-05-23 DIAGNOSIS — K7460 Unspecified cirrhosis of liver: Secondary | ICD-10-CM | POA: Diagnosis not present

## 2015-05-23 DIAGNOSIS — M5416 Radiculopathy, lumbar region: Secondary | ICD-10-CM

## 2015-05-23 DIAGNOSIS — G8929 Other chronic pain: Secondary | ICD-10-CM | POA: Diagnosis not present

## 2015-05-23 DIAGNOSIS — E291 Testicular hypofunction: Secondary | ICD-10-CM

## 2015-05-23 DIAGNOSIS — R188 Other ascites: Secondary | ICD-10-CM

## 2015-05-23 MED ORDER — OXYCODONE HCL 30 MG PO TABS: 30.0000 mg | ORAL_TABLET | ORAL | Status: DC | PRN

## 2015-05-23 NOTE — Progress Notes (Signed)
Subjective:  By signing my name below, I, Stann Ore, attest that this documentation has been prepared under the direction and in the presence of [name], MD. Electronically Signed: Stann Ore, Scribe. 05/23/2015 , 2:57 PM .  Patient was seen in Room 10 .   Patient ID: Mike Sosa, male    DOB: March 29, 1975, 40 y.o.   MRN: [id number] Chief Complaint  Patient presents with  . Medication Refill   HPI Mike Sosa is a 40 y.o. male who presents to [facility] for medication refill.  He went to [facility] and was able to remove Enough fluid that in the process he is much more active and has lost 60 lbs  He is completing all the preapproval for liver transplant  Wt Readings from Last 3 Encounters:  05/23/15 212 lb (96.163 kg)  04/16/15 278 lb (126.1 kg)  04/05/15 263 lb (119.296 kg)   He's hoping that he'll be able to keep the fluid off this time. He's feeling much better, and has been able to go to the gym. He was also able to return to work.   He notes his back pain is still present. He's been going to the gym, hoping to strengthen his back muscles. He believes his back pain is caused by the extra weight he carried from the fluid weight. He mentions having a really bad hamstring tightening and was able to find relief with coconut water with pineapple.   Patient Active Problem List   Diagnosis Date Noted  . Chronic hepatitis C virus infection (HCC) 03/30/2015  . Cirrhosis of liver with ascites (HCC) 02/13/2015  . Chronic hepatitis C without hepatic coma (HCC) 02/13/2015  . Ascites   . Hepatitis C antibody test positive   . Tobacco abuse   . Cirrhosis (HCC) 01/26/2015  . HTN (hypertension) 11/22/2014  . Edema 11/22/2014  . Umbilical hernia without obstruction and without gangrene 05/26/2014  . Family hx of colon cancer 03/31/2014  . Family hx of prostate cancer 03/31/2014  . Smoker 02/09/2014  . BMI 31.0-31.9,adult 02/09/2014  . Chronic radicular lumbar pain 02/09/2014   . Encounter for chronic pain management 02/09/2014  . Elevated BP 02/09/2014    Current outpatient prescriptions:  .  furosemide (LASIX) 40 MG tablet, TAKE ONE TABLET BY MOUTH TWICE DAILY (Patient taking differently: TAKE  TABLET BY MOUTH TWICE DAILY), Disp: 60 tablet, Rfl: 0 .  gabapentin (NEURONTIN) 300 MG capsule, Take 1-2 capsules (300-600 mg total) by mouth at bedtime. (Patient taking differently: Take 300 mg by mouth at bedtime. ), Disp: 60 capsule, Rfl: 5 .  oxycodone (ROXICODONE) 30 MG immediate release tablet, Take 1 tablet (30 mg total) by mouth every 4 (four) hours as needed for pain. For 06/04/15 or after, Disp: 180 tablet, Rfl: 0 .  sildenafil (VIAGRA) 100 MG tablet, Take 0.5-1 tablets (50-100 mg total) by mouth daily as needed for erectile dysfunction., Disp: 5 tablet, Rfl: 11 .  spironolactone (ALDACTONE) 100 MG tablet, Take 1 tablet (100 mg total) by mouth daily. (Patient taking differently: Take 200 mg by mouth daily at 12 noon. ), Disp: 30 tablet, Rfl: 2 .  oseltamivir (TAMIFLU) 75 MG capsule, , Disp: , Rfl: 0 .  oxycodone (ROXICODONE) 30 MG immediate release tablet, Take 1 tablet (30 mg total) by mouth every 4 (four) hours as needed for pain. For 07/04/15 or after, Disp: 180 tablet, Rfl: 0 No Known Allergies  Review of Systems  Constitutional: Negative for fever, chills, fatigue and  unexpected weight change.  Respiratory: Negative for cough.   Cardiovascular: Negative for chest pain.  Gastrointestinal: Negative for nausea and vomiting.  Musculoskeletal: Positive for back pain.  Neurological: Negative for dizziness, weakness, numbness and headaches.       Objective:   Physical Exam  Constitutional: He is oriented to person, place, and time. He appears well-developed and well-nourished. No distress.  HENT:  Head: Normocephalic and atraumatic.  Eyes: EOM are normal. Pupils are equal, round, and reactive to light.  Neck: Neck supple.  Cardiovascular: Normal rate.     Pulmonary/Chest: Effort normal. No respiratory distress.  Musculoskeletal: Normal range of motion.  Neurological: He is alert and oriented to person, place, and time.  Skin: Skin is warm and dry.  Psychiatric: He has a normal mood and affect. His behavior is normal.  Nursing note and vitals reviewed.   BP 122/84 mmHg  Pulse 100  Temp(Src) 98.2 F (36.8 C) (Oral)  Resp 16  Ht 5' 11.5" (1.816 m)  Wt 212 lb (96.163 kg)  BMI 29.16 kg/m2  SpO2 98%    Assessment & Plan:  Chronic radicular lumbar pain Meds ordered this encounter  Medications  . oxycodone (ROXICODONE) 30 MG immediate release tablet    Sig: Take 1 tablet (30 mg total) by mouth every 4 (four) hours as needed for pain. For 06/04/15 or after    Dispense:  180 tablet    Refill:  0  . oxycodone (ROXICODONE) 30 MG immediate release tablet    Sig: Take 1 tablet (30 mg total) by mouth every 4 (four) hours as needed for pain. For 07/04/15 or after    Dispense:  180 tablet    Refill:  0  His MRI suggests to me that he has other options besides chronic pain therapy although he is reluctant to change anything until all of his liver treatment is completed and then he will accept referral to orthopedics or neurosurgery   Hypogonadism in male - Plan: Ambulatory referral to Endocrinology-Dr Sharl MaKerr in Dr Hulen Shoutsutlaw's group -Although is this may be secondary to chronic opiate use he does need a full evaluation be sure this does not have  another primary or secondary etiology  Cirrhosis of liver with ascites, unspecified hepatic cirrhosis type (HCC) Chronic hepatitis C without hepatic coma (HCC) -[facility]

## 2015-05-23 NOTE — Patient Instructions (Signed)
Referral to endocrine Dr Sharl MaKerr

## 2015-05-25 ENCOUNTER — Other Ambulatory Visit: Payer: Self-pay | Admitting: Gastroenterology

## 2015-05-26 ENCOUNTER — Ambulatory Visit (HOSPITAL_COMMUNITY): Payer: BLUE CROSS/BLUE SHIELD | Admitting: Certified Registered Nurse Anesthetist

## 2015-05-26 ENCOUNTER — Encounter (HOSPITAL_COMMUNITY)
Admission: RE | Admit: 2015-05-26 | Discharge: 2015-05-26 | Disposition: A | Discharge status: Home / Self Care | Payer: BLUE CROSS/BLUE SHIELD | Source: Ambulatory Visit | Attending: Gastroenterology | Admitting: Gastroenterology

## 2015-05-26 ENCOUNTER — Encounter (HOSPITAL_COMMUNITY)
Admission: RE | Disposition: A | Discharge status: Home / Self Care | Payer: Self-pay | Source: Ambulatory Visit | Attending: Gastroenterology

## 2015-05-26 ENCOUNTER — Ambulatory Visit (HOSPITAL_COMMUNITY)
Admission: RE | Admit: 2015-05-26 | Discharge: 2015-05-26 | Disposition: A | Discharge status: Home / Self Care | Payer: BLUE CROSS/BLUE SHIELD | Source: Ambulatory Visit | Attending: Gastroenterology | Admitting: Gastroenterology

## 2015-05-26 ENCOUNTER — Encounter (HOSPITAL_COMMUNITY): Payer: Self-pay

## 2015-05-26 ENCOUNTER — Encounter (HOSPITAL_COMMUNITY): Payer: BLUE CROSS/BLUE SHIELD

## 2015-05-26 DIAGNOSIS — K269 Duodenal ulcer, unspecified as acute or chronic, without hemorrhage or perforation: Secondary | ICD-10-CM | POA: Diagnosis not present

## 2015-05-26 DIAGNOSIS — K766 Portal hypertension: Secondary | ICD-10-CM | POA: Diagnosis not present

## 2015-05-26 DIAGNOSIS — K3189 Other diseases of stomach and duodenum: Secondary | ICD-10-CM | POA: Diagnosis not present

## 2015-05-26 DIAGNOSIS — F17210 Nicotine dependence, cigarettes, uncomplicated: Secondary | ICD-10-CM | POA: Insufficient documentation

## 2015-05-26 DIAGNOSIS — K7460 Unspecified cirrhosis of liver: Secondary | ICD-10-CM

## 2015-05-26 DIAGNOSIS — D696 Thrombocytopenia, unspecified: Secondary | ICD-10-CM | POA: Insufficient documentation

## 2015-05-26 DIAGNOSIS — I8510 Secondary esophageal varices without bleeding: Secondary | ICD-10-CM | POA: Diagnosis not present

## 2015-05-26 DIAGNOSIS — Z79891 Long term (current) use of opiate analgesic: Secondary | ICD-10-CM | POA: Insufficient documentation

## 2015-05-26 HISTORY — PX: ESOPHAGOGASTRODUODENOSCOPY (EGD) WITH PROPOFOL: SHX5813

## 2015-05-26 LAB — ABO/RH: ABO/RH(D): A NEG

## 2015-05-26 SURGERY — ESOPHAGOGASTRODUODENOSCOPY (EGD) WITH PROPOFOL
Anesthesia: Monitor Anesthesia Care

## 2015-05-26 MED ORDER — SODIUM CHLORIDE 0.9 % IV SOLN: INTRAVENOUS | Status: DC

## 2015-05-26 MED ORDER — ONDANSETRON HCL 4 MG/2ML IJ SOLN
INTRAMUSCULAR | Status: DC | PRN
  Administered 2015-05-26: 4 mg via INTRAVENOUS

## 2015-05-26 MED ORDER — LACTATED RINGERS IV SOLN
INTRAVENOUS | Status: DC | PRN
  Administered 2015-05-26: 11:00:00 via INTRAVENOUS

## 2015-05-26 MED ORDER — PROPOFOL 10 MG/ML IV BOLUS
INTRAVENOUS | Status: AC
  Filled 2015-05-26: qty 40

## 2015-05-26 MED ORDER — ONDANSETRON HCL 4 MG/2ML IJ SOLN
INTRAMUSCULAR | Status: AC
  Filled 2015-05-26: qty 2

## 2015-05-26 MED ORDER — SODIUM CHLORIDE 0.9 % IV SOLN
Freq: Once | INTRAVENOUS | Status: AC
  Administered 2015-05-26: 08:00:00 via INTRAVENOUS

## 2015-05-26 MED ORDER — PROPOFOL 500 MG/50ML IV EMUL
INTRAVENOUS | Status: DC | PRN
  Administered 2015-05-26: 250 ug/kg/min via INTRAVENOUS

## 2015-05-26 SURGICAL SUPPLY — 14 items

## 2015-05-26 NOTE — Discharge Instructions (Signed)

## 2015-05-26 NOTE — Discharge Instructions (Signed)
Platelet Transfusion  A platelet transfusion is a procedure in which you receive donated platelets through an IV tube. Platelets are tiny pieces of blood cells. When a blood vessel is damaged, platelets collect in the damaged area to help form a blood clot. This begins the healing process. If your platelet count gets too low, your blood may have trouble clotting.  You may need a platelet transfusion if you have a condition that causes a low number of platelets (thrombocytopenia). A platelet transfusion may be used to stop or prevent bleeding.  LET YOUR HEALTH CARE PROVIDER KNOW ABOUT:   Any allergies you have.   All medicines you are taking, including vitamins, herbs, eye drops, creams, and over-the-counter medicines.   Previous problems you or members of your family have had with the use of anesthetics.   Any blood disorders you have.   Previous surgeries you have had.   Any medical conditions you may have.   Any reactions you have had during a previous transfusion. RISKS AND COMPLICATIONS Generally, this is a safe procedure. However, problems may occur, including:   Fever with or without chills. The fever usually occurs within the first 4 hours of the transfusion and returns to normal within 48 hours.  Allergic reaction. The reaction is most commonly caused by antibodies your body creates against substances in the transfusion. Signs of an allergic reaction may include itching, hives, difficulty breathing, shock, or low blood pressure.  Sudden (acute) or delayed hemolytic reaction. This rare reaction can occur during the transfusion and up to 28 days after the transfusion. The reaction usually occurs when your body's defense system (immune system) attacks the new platelets. Signs of a hemolytic reaction may include fever, headache, difficulty breathing, low blood pressure, a rapid heartbeat, or pain in your back, abdomen, chest, or IV site.  Transfusion-related acute lung injury  (TRALI). TRALI can occur within hours of a transfusion, or several days later. This is a rare reaction that causes lung damage. The cause is not known.  Infection. Signs of this rare complication may include fever, chills, vomiting, a rapid heartbeat, or low blood pressure. BEFORE THE PROCEDURE   You may have a blood test to determine your blood type. This is necessary to find out what kind ofplatelets best matches your platelets.  If you have had an allergic reaction to a transfusion in the past, you may be given medicine to help prevent a reaction. Take this medicine only as directed by your health care provider.  Your temperature, blood pressure, and pulse will be monitored before the transfusion. PROCEDURE  An IV will be started in your hand or arm.  The transfusion will be attached to your IV tubing. The bag of donated platelets will be attached to your IV tube andgiven into your vein.  Your temperature, blood pressure, and pulse will be monitored regularly during the transfusion. This monitoring is done to help detect early signs of a transfusion reaction.  If you have any signs or symptoms of a reaction, your transfusion will be stopped and you may be given medicine.  When your transfusion is complete, your IV will be removed.  Pressure may be applied to the IV site for a few minutes.  A bandage (dressing) will be applied. The procedure may vary among health care providers and hospitals. AFTER THE PROCEDURE  Your blood pressure, temperature, and pulse will be monitored regularly.   This information is not intended to replace advice given to you by your health   care provider. Make sure you discuss any questions you have with your health care provider.   Document Released: 11/13/2006 Document Revised: 02/06/2014 Document Reviewed: 11/26/2013 Elsevier Interactive Patient Education 2016 Elsevier Inc.  

## 2015-05-26 NOTE — Anesthesia Preprocedure Evaluation (Signed)
Anesthesia Evaluation  Patient identified by MRN, date of birth, ID band Patient awake    Reviewed: Allergy & Precautions, NPO status , Patient's Chart, lab work & pertinent test results  History of Anesthesia Complications Negative for: history of anesthetic complications  Airway Mallampati: II  TM Distance: >3 FB Neck ROM: Full    Dental  (+) Chipped, Teeth Intact,    Pulmonary neg shortness of breath, neg sleep apnea, neg COPD, neg recent URI, Current Smoker, neg PE   breath sounds clear to auscultation       Cardiovascular (-) hypertension(-) angina(-) Past MI and (-) CHF  Rhythm:Regular     Neuro/Psych negative neurological ROS  negative psych ROS   GI/Hepatic (+) Hepatitis -, CEsophageal variceal bleeding?   Endo/Other  negative endocrine ROS  Renal/GU negative Renal ROS     Musculoskeletal  (+) Arthritis ,   Abdominal   Peds  Hematology thrombocytopenia   Anesthesia Other Findings Chronic narcotics for back pain  Reproductive/Obstetrics                             Anesthesia Physical Anesthesia Plan  ASA: III  Anesthesia Plan: MAC   Post-op Pain Management:    Induction: Intravenous  Airway Management Planned: Natural Airway, Nasal Cannula and Simple Face Mask  Additional Equipment: None  Intra-op Plan:   Post-operative Plan:   Informed Consent: I have reviewed the patients History and Physical, chart, labs and discussed the procedure including the risks, benefits and alternatives for the proposed anesthesia with the patient or authorized representative who has indicated his/her understanding and acceptance.   Dental advisory given  Plan Discussed with: CRNA and Surgeon  Anesthesia Plan Comments:         Anesthesia Quick Evaluation

## 2015-05-26 NOTE — H&P (Signed)
Patient interval history reviewed.  Patient examined again.  There has been no change from documented H/P dated 05/25/15 (scanned into chart from our office) except as documented above.  Assessment:  1.  Cirrhosis.  Plan:  1.  Endoscopy for gastroesophageal variceal screening. 2.  Risks (bleeding, infection, bowel perforation that could require surgery, sedation-related changes in cardiopulmonary systems), benefits (identification and possible treatment of source of symptoms, exclusion of certain causes of symptoms), and alternatives (watchful waiting, radiographic imaging studies, empiric medical treatment) of upper endoscopy (EGD) were explained to patient/family in detail and patient wishes to proceed.

## 2015-05-26 NOTE — Op Note (Signed)
[facility] Patient Name: Mike Sosa Procedure Date: 05/26/2015 MRN: [id number] Attending MD: Willis ModenaWilliam Genaro Bekker , MD Date of Birth: [birth year] CSN:  Age: [age] Admit Type: Outpatient Procedure:                Upper GI endoscopy Indications:              Screening procedure, Cirrhosis rule out esophageal                            varices Providers:                Willis ModenaWilliam Olar Santini, MD, Dow AdolphKaren Hinson, RN, Oletha Blendavida                            Shoffner, Technician, Maricela CuretLaura Peters, CRNA Referring MD:             Dr. Medicines:                Propofol per Anesthesia Complications:            No immediate complications. Estimated Blood Loss:     Estimated blood loss was minimal. Procedure:                Pre-Anesthesia Assessment:                           - Prior to the procedure, a History and Physical                            was performed, and patient medications and                            allergies were reviewed. The patient's tolerance of                            previous anesthesia was also reviewed. The risks                            and benefits of the procedure and the sedation                            options and risks were discussed with the patient.                            All questions were answered, and informed consent                            was obtained. Prior Anticoagulants: The patient has                            taken no previous anticoagulant or antiplatelet                            agents. ASA Grade Assessment: III - A patient with  severe systemic disease. After reviewing the risks                            and benefits, the patient was deemed in                            satisfactory condition to undergo the procedure.                           After obtaining informed consent, the endoscope was                            passed under direct vision. Throughout the                            procedure, the  patient's blood pressure, pulse, and                            oxygen saturations were monitored continuously. The                            Endoscope was introduced through the mouth, and                            advanced to the second part of duodenum. The upper                            GI endoscopy was accomplished without difficulty.                            The patient tolerated the procedure well. Scope In: Scope Out: Findings:      Two columns of non-bleeding grade II varices were found in the lower       third of the esophagus,. No stigmata of recent bleeding were evident and       no red wale signs were present.      The Z-line was irregular.      The exam of the esophagus was otherwise normal.      Moderate portal hypertensive gastropathy was found in the entire       examined stomach.      The exam of the stomach was otherwise normal.      A single localized erosion without bleeding was found in the duodenal       bulb.      The exam of the duodenum was otherwise normal. Impression:               - Non-bleeding grade II esophageal varices.                           - Z-line irregular.                           - Portal hypertensive gastropathy.                           - Duodenal erosion without bleeding.  Moderate Sedation:      N/A- Per Anesthesia Care Recommendation:           - Patient has a contact number available for                            emergencies. The signs and symptoms of potential                            delayed complications were discussed with the                            patient. Return to normal activities tomorrow.                            Written discharge instructions were provided to the                            patient.                           - Discharge patient to home (via wheelchair).                           - Continue present medications.                           - Start propranolol 10 mg po bid.                            - Return to GI clinic in 3 months.                           - Return to referring physician as previously                            scheduled.                           - Low sodium diet today. Procedure Code(s):        --- Professional ---                           [phone], Esophagogastroduodenoscopy, flexible,                            transoral; diagnostic, including collection of                            specimen(s) by brushing or washing, when performed                            (separate procedure) Diagnosis Code(s):        --- Professional ---                           K74.60, Unspecified cirrhosis of liver  I85.10, Secondary esophageal varices without                            bleeding                           K22.8, Other specified diseases of esophagus                           K76.6, Portal hypertension                           K31.89, Other diseases of stomach and duodenum                           K26.9, Duodenal ulcer, unspecified as acute or                            chronic, without hemorrhage or perforation                           Z13.810, Encounter for screening for upper                            gastrointestinal disorder CPT copyright 2016 American Medical Association. All rights reserved. The codes documented in this report are preliminary and upon coder review may  be revised to meet current compliance requirements. Willis Modena, MD 05/26/2015 12:07:01 PM This report has been signed electronically. Number of Addenda: 0

## 2015-05-26 NOTE — Transfer of Care (Signed)
Immediate Anesthesia Transfer of Care Note  Patient: Mike Sosa  Procedure(s) Performed: Procedure(s): ESOPHAGOGASTRODUODENOSCOPY (EGD) WITH PROPOFOL (N/A)  Patient Location: PACU  Anesthesia Type:MAC  Level of Consciousness:  sedated, patient cooperative and responds to stimulation  Airway & Oxygen Therapy:Patient Spontanous Breathing and Patient connected to face mask oxgen  Post-op Assessment:  Report given to PACU RN and Post -op Vital signs reviewed and stable  Post vital signs:  Reviewed and stable  Last Vitals:  Filed Vitals:   05/26/15 1030 05/26/15 1033  BP:  113/65  Pulse:  78  Temp: 36.8 C   Resp:  10    Complications: No apparent anesthesia complications

## 2015-05-27 ENCOUNTER — Encounter (HOSPITAL_COMMUNITY): Payer: Self-pay | Admitting: Gastroenterology

## 2015-05-27 LAB — PREPARE PLATELET PHERESIS
UNIT DIVISION: 0
Unit division: 0

## 2015-05-29 NOTE — Anesthesia Postprocedure Evaluation (Signed)
Anesthesia Post Note  Patient: Mike Sosa  Procedure(s) Performed: Procedure(s) (LRB): ESOPHAGOGASTRODUODENOSCOPY (EGD) WITH PROPOFOL (N/A)  Patient location during evaluation: Endoscopy Anesthesia Type: MAC Level of consciousness: awake Pain management: pain level controlled Vital Signs Assessment: post-procedure vital signs reviewed and stable Respiratory status: spontaneous breathing Cardiovascular status: stable Postop Assessment: no signs of nausea or vomiting Anesthetic complications: no    Last Vitals:  Filed Vitals:   05/26/15 1220 05/26/15 1230  BP: 107/63 112/65  Pulse: 79 78  Temp:    Resp: 9 10    Last Pain: There were no vitals filed for this visit.               Jermisha Hoffart

## 2015-07-14 ENCOUNTER — Ambulatory Visit: Payer: BLUE CROSS/BLUE SHIELD | Admitting: Endocrinology

## 2015-07-14 DIAGNOSIS — Z0289 Encounter for other administrative examinations: Secondary | ICD-10-CM

## 2015-07-15 ENCOUNTER — Telehealth: Payer: Self-pay | Admitting: Endocrinology

## 2015-07-15 NOTE — Telephone Encounter (Signed)
Please come back for a new pt appointment in 3 months

## 2015-07-15 NOTE — Telephone Encounter (Signed)
Patient no showed today's appt. Please advise on how to follow up. °A. No follow up necessary. °B. Follow up urgent. Contact patient immediately. °C. Follow up necessary. Contact patient and schedule visit in ___ days. °D. Follow up advised. Contact patient and schedule visit in ____weeks. ° °

## 2015-07-19 ENCOUNTER — Telehealth: Payer: Self-pay | Admitting: Endocrinology

## 2015-07-19 NOTE — Telephone Encounter (Signed)
Called PT to R/S no answer

## 2015-08-02 ENCOUNTER — Ambulatory Visit (INDEPENDENT_AMBULATORY_CARE_PROVIDER_SITE_OTHER): Payer: BLUE CROSS/BLUE SHIELD | Admitting: Internal Medicine

## 2015-08-02 VITALS — BP 116/84 | HR 95 | Temp 98.1°F | Resp 18 | Ht 71.5 in | Wt 243.2 lb

## 2015-08-02 DIAGNOSIS — F17200 Nicotine dependence, unspecified, uncomplicated: Secondary | ICD-10-CM

## 2015-08-02 DIAGNOSIS — G8929 Other chronic pain: Secondary | ICD-10-CM

## 2015-08-02 DIAGNOSIS — G894 Chronic pain syndrome: Secondary | ICD-10-CM | POA: Insufficient documentation

## 2015-08-02 DIAGNOSIS — Z7189 Other specified counseling: Secondary | ICD-10-CM

## 2015-08-02 DIAGNOSIS — B182 Chronic viral hepatitis C: Secondary | ICD-10-CM | POA: Diagnosis not present

## 2015-08-02 DIAGNOSIS — M5416 Radiculopathy, lumbar region: Secondary | ICD-10-CM

## 2015-08-02 DIAGNOSIS — Z720 Tobacco use: Secondary | ICD-10-CM | POA: Diagnosis not present

## 2015-08-02 MED ORDER — OXYCODONE HCL 30 MG PO TABS: 30.0000 mg | ORAL_TABLET | ORAL | Status: AC | PRN

## 2015-08-02 NOTE — Progress Notes (Signed)
Chief Complaint  Patient presents with  . Medication Refill    OXYCODONE, WOULD LIKE REFERRAL TO PAIN MANAGEMENT    See hx [facility] and controlling ascites til then with diuretics Out of them 2weeks and gained 20-30 lbs  Wt Readings from Last 3 Encounters:  08/02/15 243 lb 3.2 oz (110.315 kg)  05/26/15 212 lb (96.163 kg)  05/23/15 212 lb (96.163 kg)   Still able to work.  Continues with [facility] with lumbar issues and is now ready to transition to pain management as I retire  He has not completed w/u for low testosterone as referred--  Ex BP 116/84 mmHg  Pulse 95  Temp(Src) 98.1 F (36.7 C) (Oral)  Resp 18  Ht 5' 11.5" (1.816 m)  Wt 243 lb 3.2 oz (110.315 kg)  BMI 33.45 kg/m2  SpO2 96% Physical Exam  Constitutional: He is oriented to person, place, and time. He appears well-developed and well-nourished. No distress.  HENT: clear Eyes: EOM are normal. Pupils are equal, round, and reactive to light.  Neck: Neck supple.  Cardiovascular: Normal rate.  Pulmonary/Chest: Effort normal. No respiratory distress.  Musculoskeletal: Normal range of motion.  Neurological: He is alert and oriented to person, place, and time.  Skin: Skin is warm and dry.  Psychiatric: He has a normal mood and affect. His behavior is normal.   IMP Chronic hepatitis C without hepatic coma (HCC)  Encounter for chronic pain management - Plan: Ambulatory referral to Pain Clinic  Chronic radicular lumbar pain - Plan: Ambulatory referral to Pain Clinic  Smoker  Meds ordered this encounter  Medications  . oxycodone (ROXICODONE) 30 MG immediate release tablet    Sig: Take 1 tablet (30 mg total) by mouth every 4 (four) hours as needed for pain.    Dispense:  180 tablet    Refill:  0   [facility]  [id number] [phone]

## 2015-08-02 NOTE — Patient Instructions (Signed)
     IF you received an x-Tilly today, you will receive an invoice from [facility]. Please contact [facility] at [phone] with questions or concerns regarding your invoice.   IF you received labwork today, you will receive an invoice from [facility]. Please contact [facility] at [phone] with questions or concerns regarding your invoice.   Our billing staff will not be able to assist you with questions regarding bills from these companies.  You will be contacted with the lab results as soon as they are available. The fastest way to get your results is to activate your My Chart account. Instructions are located on the last page of this paperwork. If you have not heard from us regarding the results in 2 weeks, please contact this office.      

## 2015-08-18 ENCOUNTER — Telehealth: Payer: Self-pay

## 2015-08-18 MED ORDER — GABAPENTIN 300 MG PO CAPS: 300.0000 mg | ORAL_CAPSULE | Freq: Every day | ORAL | Status: AC

## 2015-08-18 NOTE — Telephone Encounter (Signed)
fax req Gabapentin 300mg  Sig 1-2 cap at bedtime-[place]ittles pt.  Sent to pa pool.

## 2015-08-18 NOTE — Telephone Encounter (Signed)
Meds ordered this encounter  Medications  . gabapentin (NEURONTIN) 300 MG capsule    Sig: Take 1 capsule (300 mg total) by mouth at bedtime.    Dispense:  60 capsule    Refill:  0    Order Specific Question:  Supervising Provider    Answer:  Sherren MochaSHAW, EVA N [phone]]    Per Dr. Netta Corriganoolittle's note 08/02/2015, this patient was referred to Dr. Tollie EthPlummer. What is the status?

## 2015-09-28 ENCOUNTER — Other Ambulatory Visit: Payer: Self-pay | Admitting: Physician Assistant

## 2015-09-29 ENCOUNTER — Telehealth: Payer: Self-pay | Admitting: *Deleted

## 2015-09-29 NOTE — Telephone Encounter (Signed)
Spoke with patient he will be calling Dr. Tollie EthPlummer for his refills

## 2015-12-31 ENCOUNTER — Ambulatory Visit: Payer: BLUE CROSS/BLUE SHIELD

## 2016-01-21 ENCOUNTER — Encounter: Payer: Self-pay | Admitting: Physician Assistant

## 2016-07-30 DEATH — deceased

## 2016-08-25 IMAGING — US US PARACENTESIS
1 series · 5 of 5 positions shown · non-contrast
Comparison: none

INDICATION: Cirrhosis, hepatitis-C, recurrent ascites. Request is made for
therapeutic paracentesis up to 7 liters.

[Series 1: us paracentesis · 0.26mm/px · 5 of 5 slices shown]
[im 1/5]
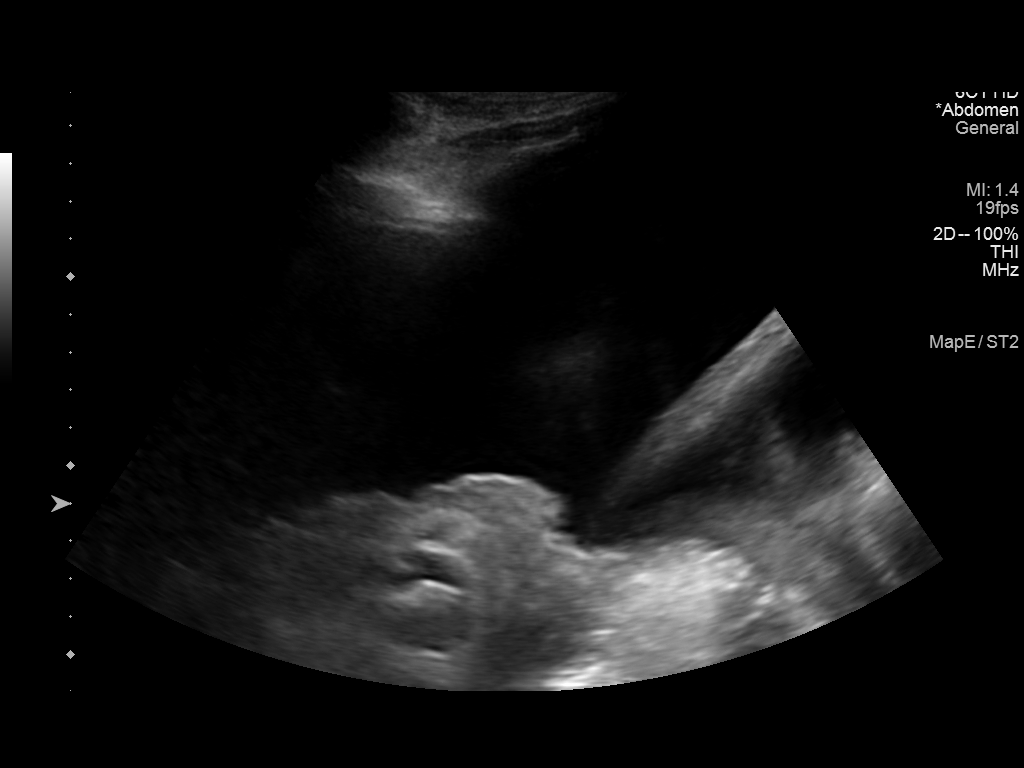
[im 2/5]
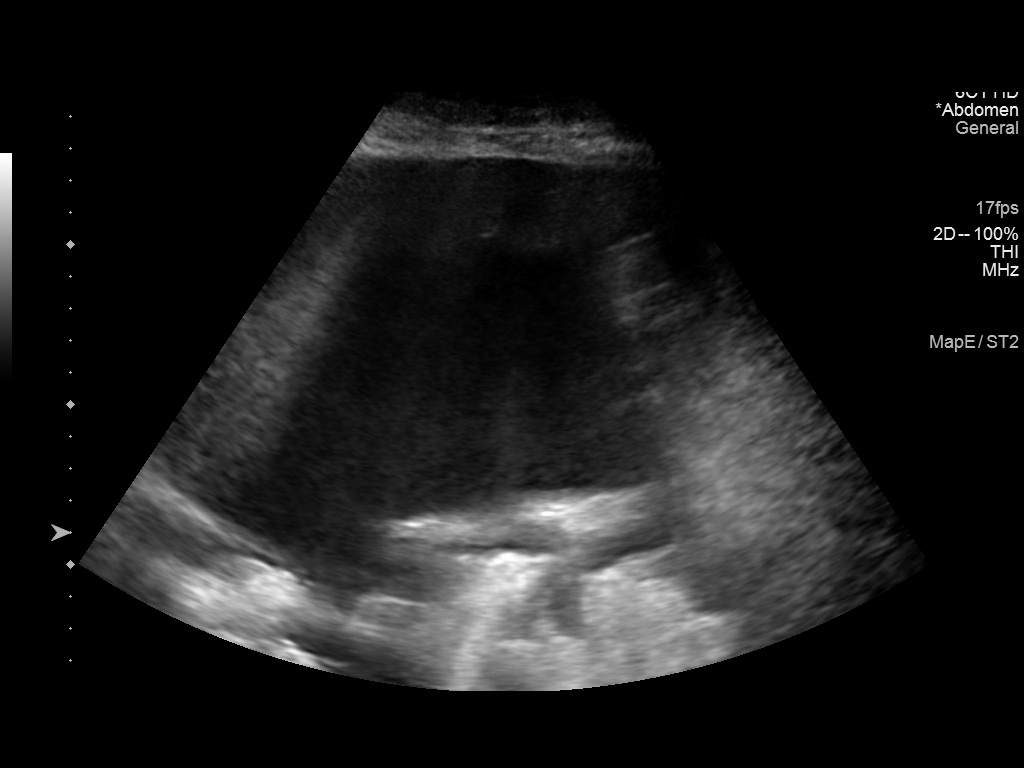
[im 3/5]
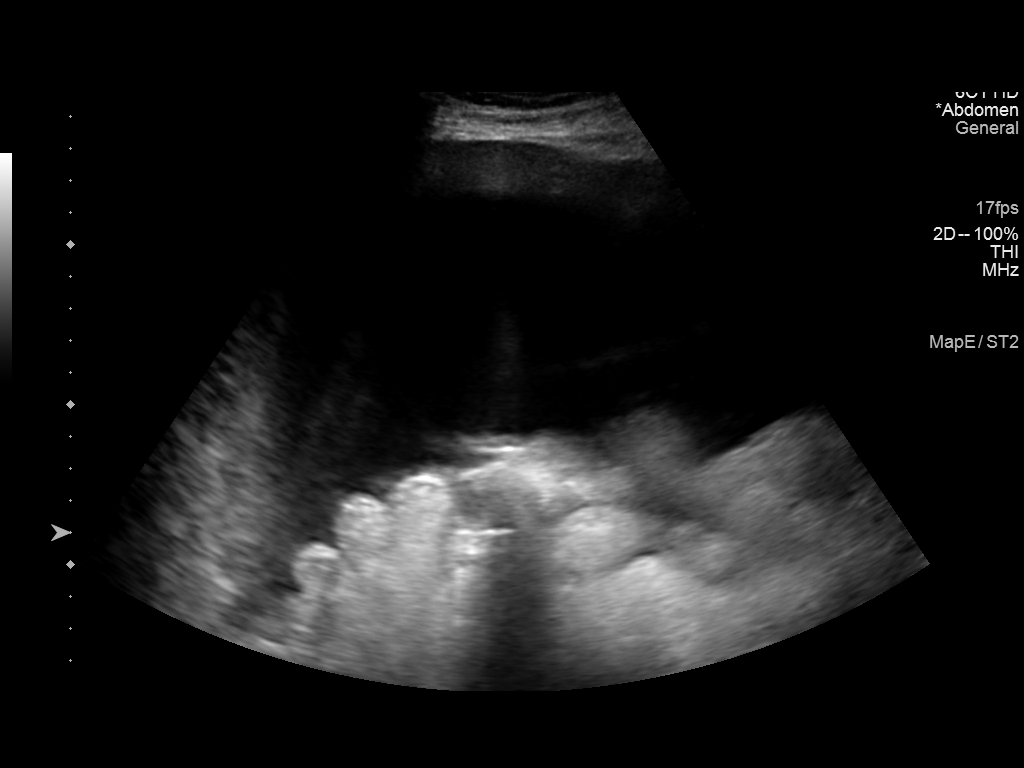
[im 4/5]
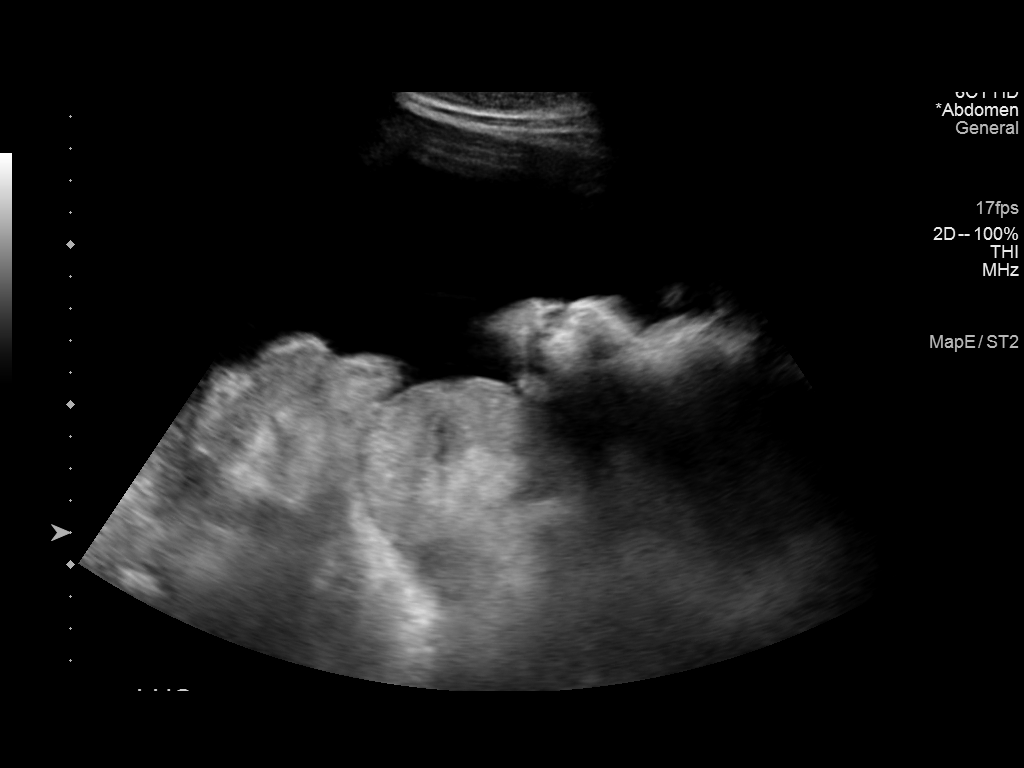
[im 5/5]
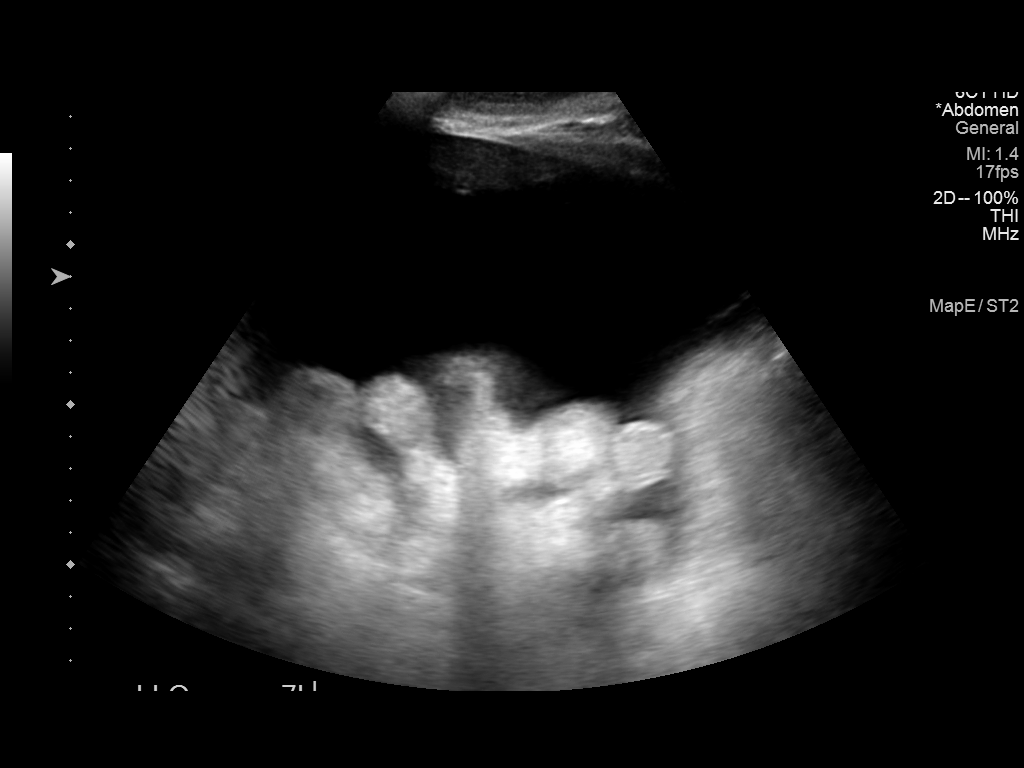

[5 of 5 positions shown; findings below may reference images not displayed]

EXAM:
ULTRASOUND GUIDED THERAPEUTIC PARACENTESIS

MEDICATIONS:
None.

COMPLICATIONS:
None immediate.

PROCEDURE:
Informed written consent was obtained from the patient after a
discussion of the risks, benefits and alternatives to treatment. A
timeout was performed prior to the initiation of the procedure.

Initial ultrasound scanning demonstrates a large amount of ascites
within the left lower abdominal quadrant. The left lower abdomen was
prepped and draped in the usual sterile fashion. 1% lidocaine was
used for local anesthesia.

Following this, a Yueh catheter was introduced. An ultrasound image
was saved for documentation purposes. The paracentesis was
performed. The catheter was removed and a dressing was applied. The
patient tolerated the procedure well without immediate post
procedural complication.
FINDINGS: A total of approximately 7 liters of yellow fluid was removed.
IMPRESSION: Successful ultrasound-guided therapeutic paracentesis yielding 7
liters of peritoneal fluid. The patient will receive IV albumin
infusion postprocedure.

## 2016-09-06 IMAGING — US US PARACENTESIS
1 series · 5 of 5 positions shown · non-contrast
Comparison: none

INDICATION: Cirrhosis, hepatitis-C, recurrent ascites. Request is made for
therapeutic paracentesis up to 8 liters.

[Series 1: us paracentesis · 0.31mm/px · 5 of 5 slices shown]
[im 1/5]
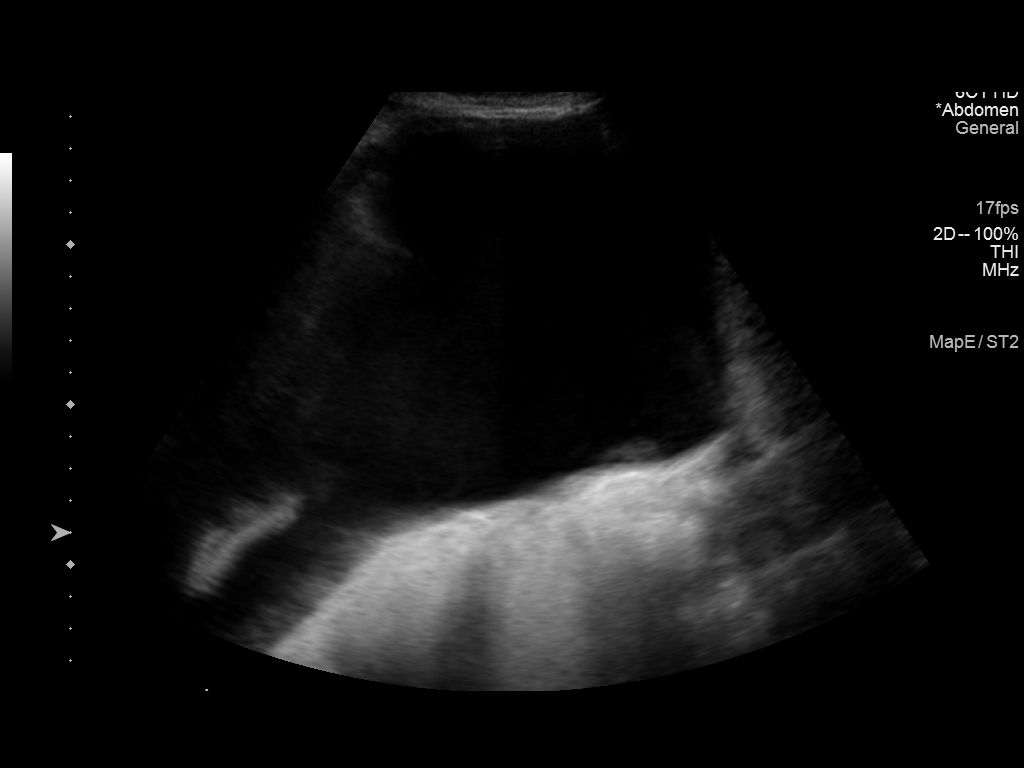
[im 2/5]
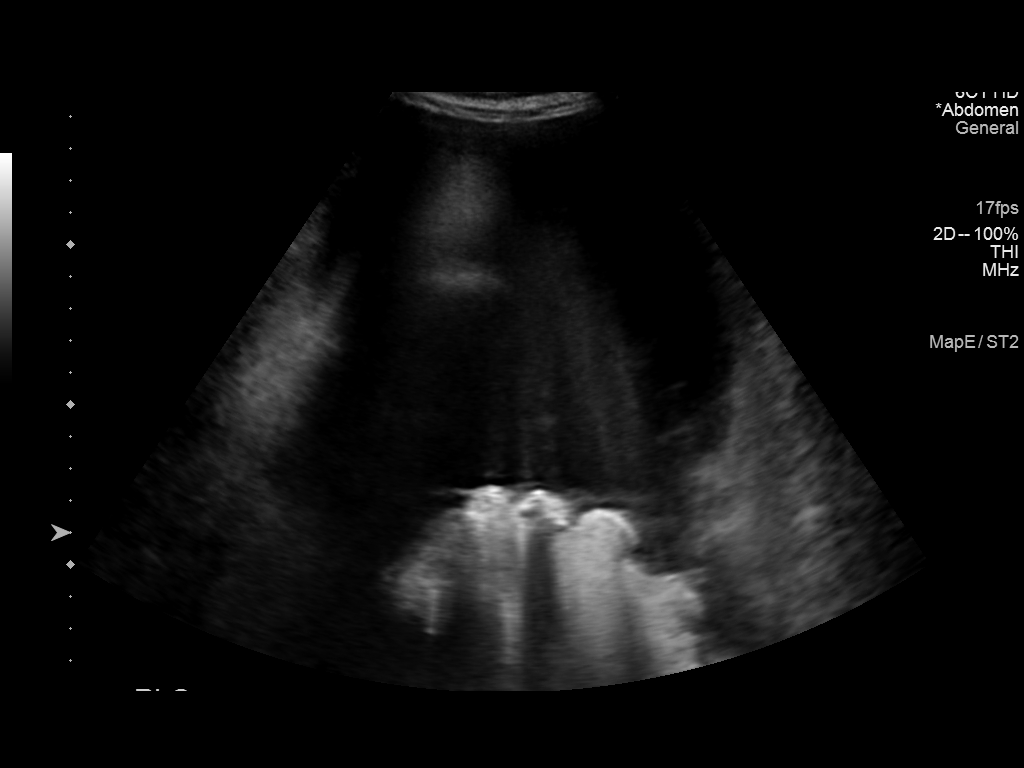
[im 3/5]
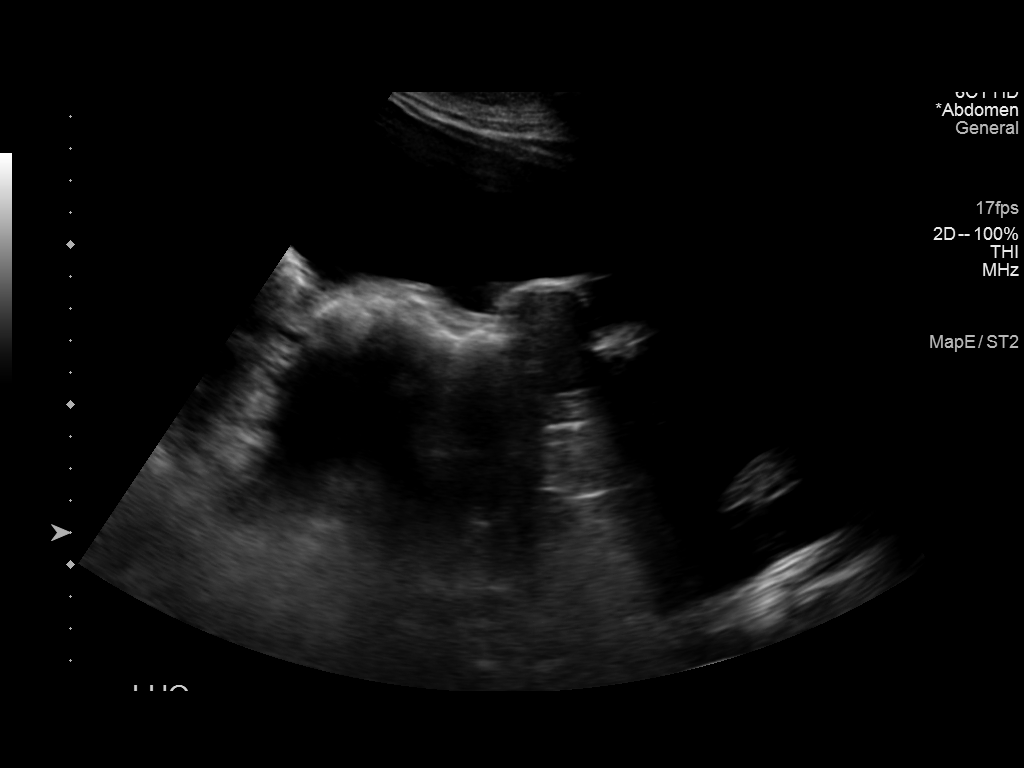
[im 4/5]
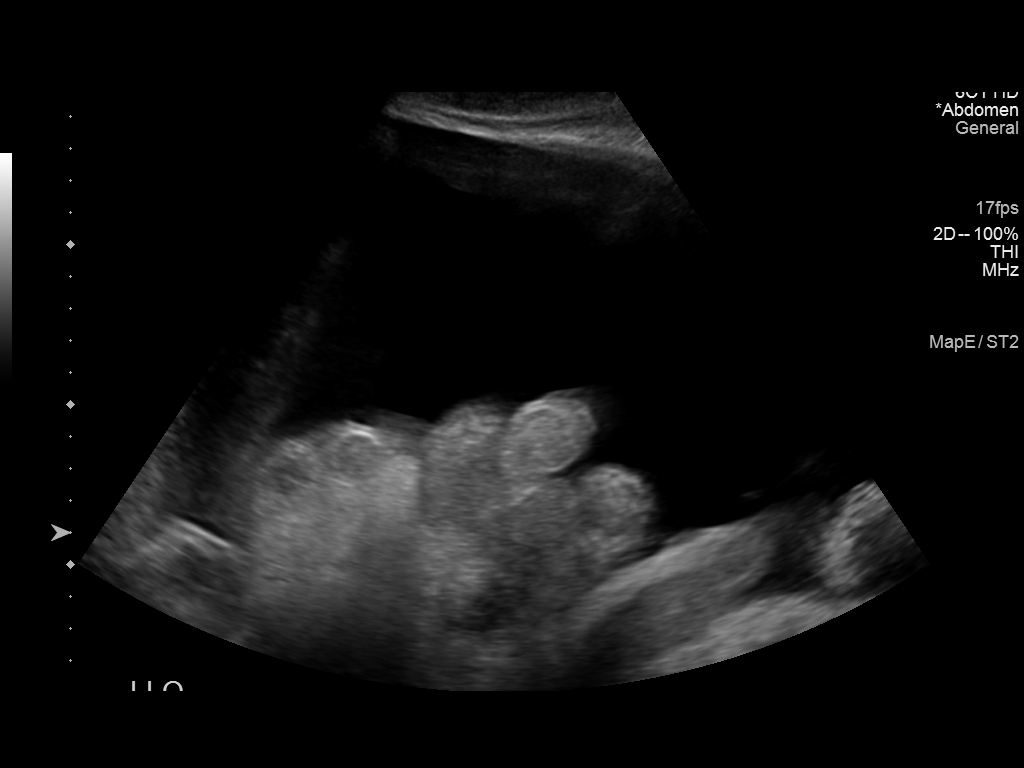
[im 5/5]
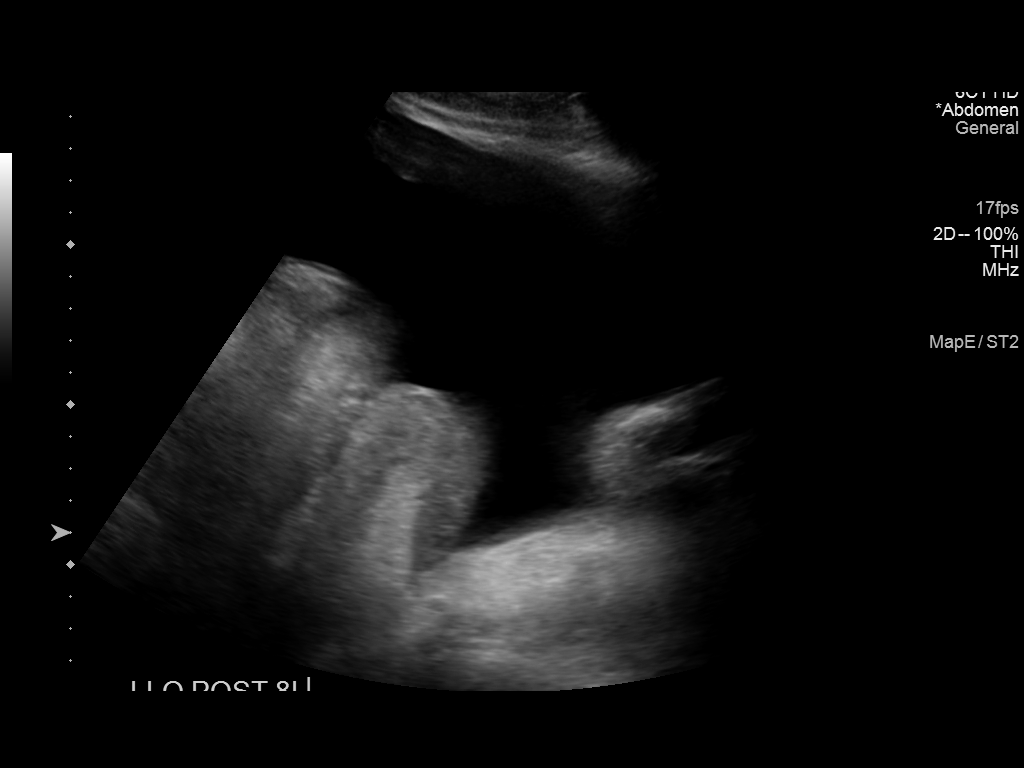

[5 of 5 positions shown; findings below may reference images not displayed]

EXAM:
ULTRASOUND GUIDED THERAPEUTIC PARACENTESIS

MEDICATIONS:
None.

COMPLICATIONS:
None immediate.

PROCEDURE:
Informed written consent was obtained from the patient after a
discussion of the risks, benefits and alternatives to treatment. A
timeout was performed prior to the initiation of the procedure.

Initial ultrasound scanning demonstrates a large amount of ascites
within the left lower abdominal quadrant. The left lower abdomen was
prepped and draped in the usual sterile fashion. 1% lidocaine was
used for local anesthesia.

Following this, a Yueh catheter was introduced. An ultrasound image
was saved for documentation purposes. The paracentesis was
performed. The catheter was removed and a dressing was applied. The
patient tolerated the procedure well without immediate post
procedural complication.
FINDINGS: A total of approximately 8 liters of yellow fluid was removed.
IMPRESSION: Successful ultrasound-guided therapeutic paracentesis yielding 8
liters of peritoneal fluid. The patient will receive IV albumin
infusion postprocedure.

## 2017-02-19 IMAGING — US US PARACENTESIS
1 series · 4 of 4 positions shown · non-contrast
Comparison: none

INDICATION: Cirrhosis, hepatitis-C, recurrent ascites. Request made for
diagnostic and therapeutic paracentesis up to 7 liters.

[Series 1: us paracentesis · 0.26mm/px · 4 of 4 slices shown]
[im 1/4]
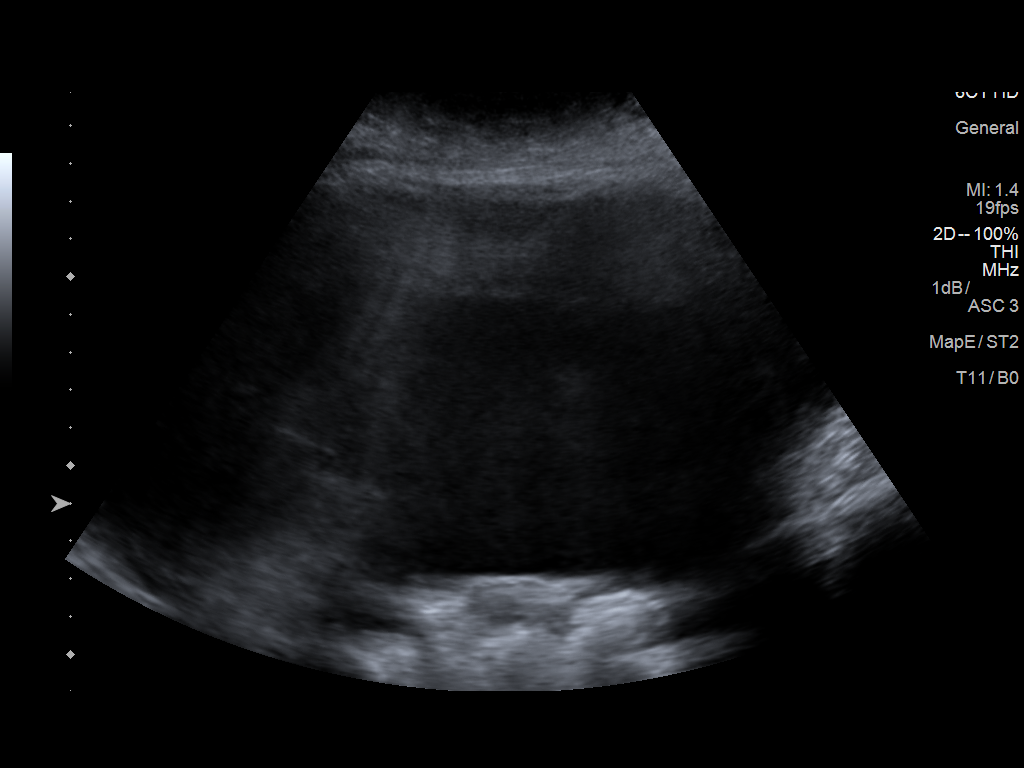
[im 2/4]
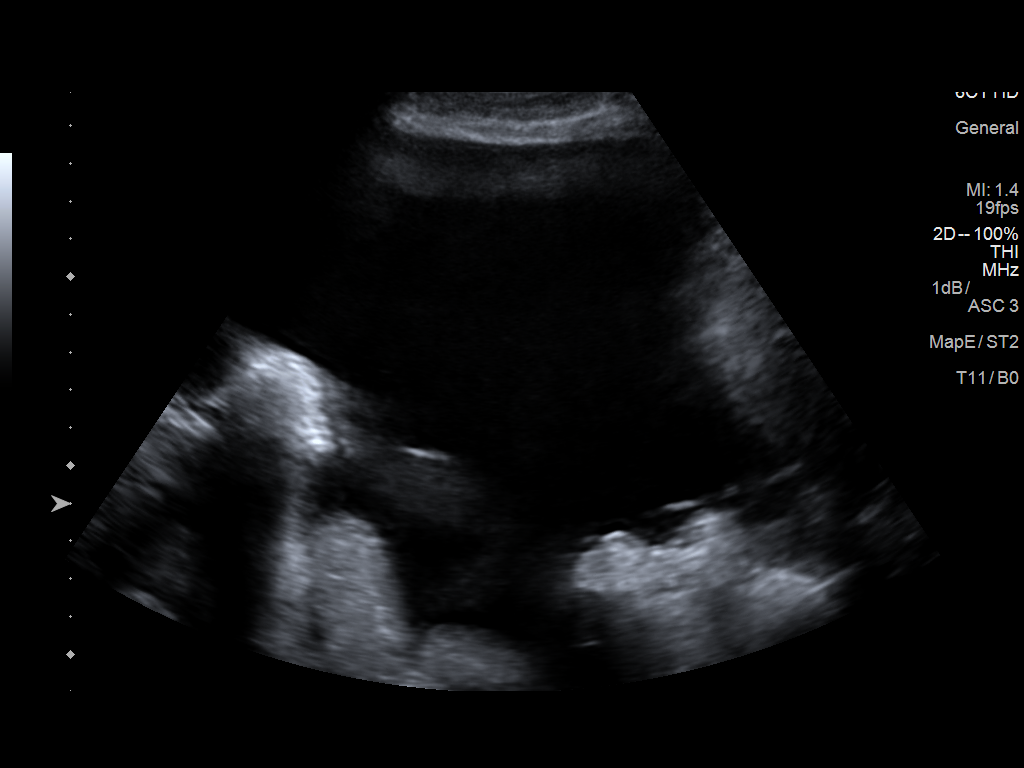
[im 3/4]
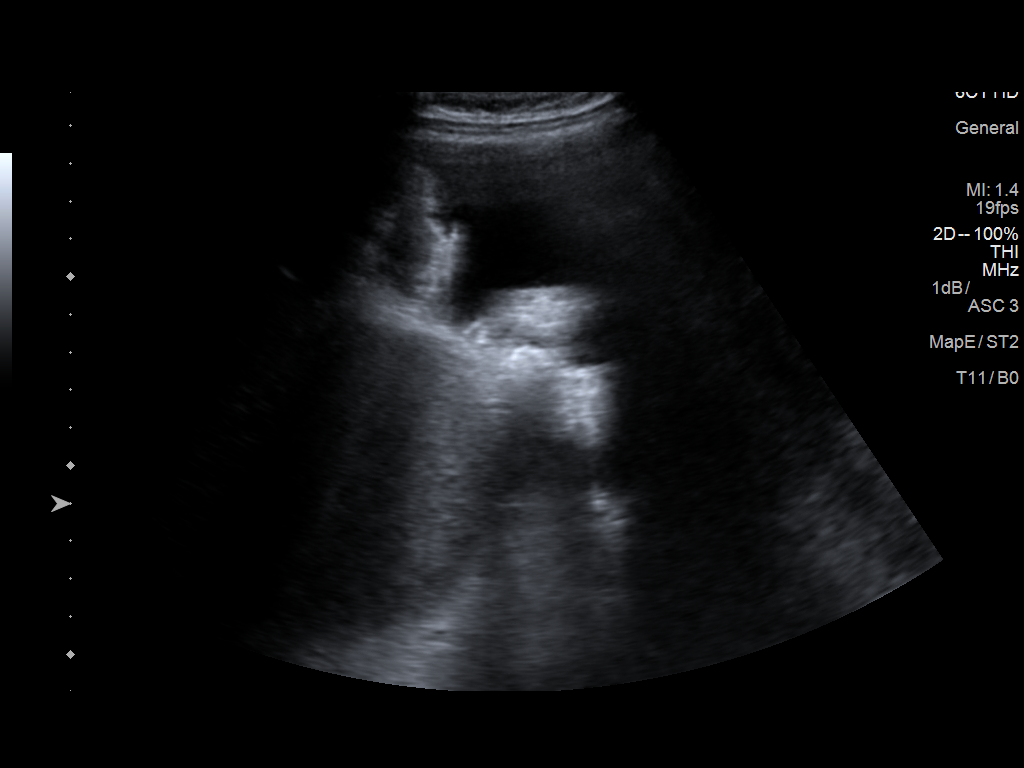
[im 4/4]
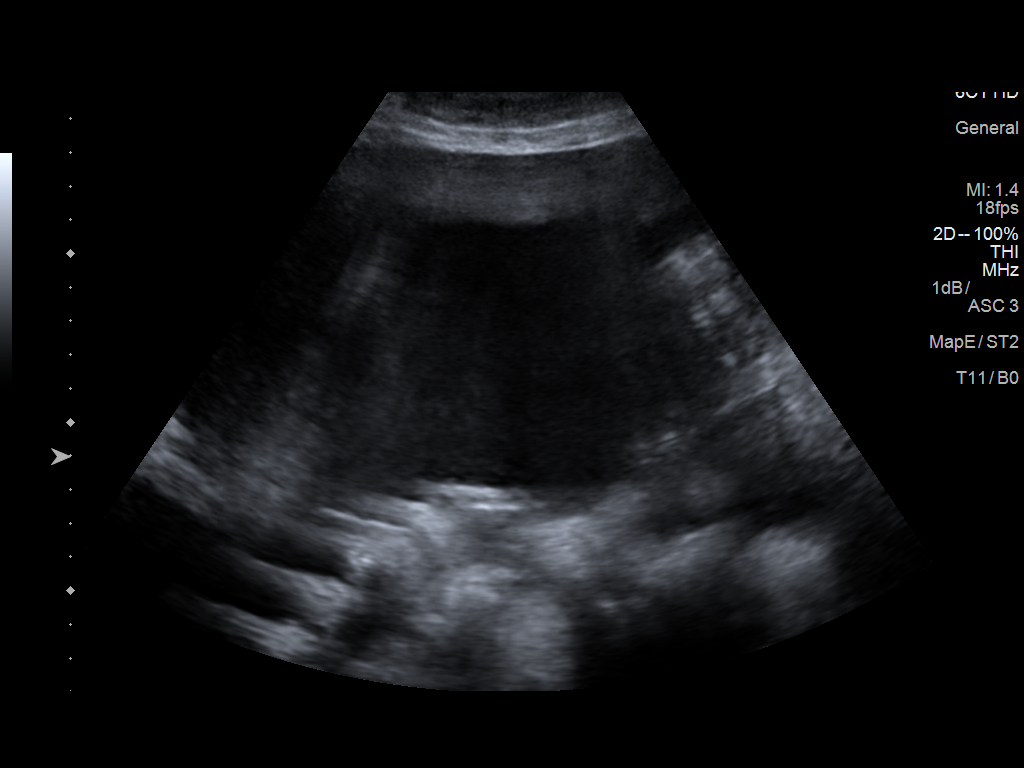

[4 of 4 positions shown; findings below may reference images not displayed]

EXAM:
ULTRASOUND GUIDED DIAGNOSTIC AND THERAPEUTIC PARACENTESIS

MEDICATIONS:
None.

COMPLICATIONS:
None immediate.

PROCEDURE:
Informed written consent was obtained from the patient after a
discussion of the risks, benefits and alternatives to treatment. A
timeout was performed prior to the initiation of the procedure.

Initial ultrasound scanning demonstrates a large amount of ascites
within the left lower abdominal quadrant. The left lower abdomen was
prepped and draped in the usual sterile fashion. 1% lidocaine was
used for local anesthesia.

Following this, a Yueh catheter was introduced. An ultrasound image
was saved for documentation purposes. The paracentesis was
performed. The catheter was removed and a dressing was applied. The
patient tolerated the procedure well without immediate post
procedural complication.
FINDINGS: A total of approximately 7 liters of yellow fluid was removed.
Samples were sent to the laboratory as requested by the clinical
team.
IMPRESSION: Successful ultrasound-guided diagnostic and therapeutic paracentesis
yielding 7 liters of peritoneal fluid. The patient will receive IV
albumin infusion postprocedure.
